# Patient Record
Sex: Female | Born: 2000
Health system: Southern US, Community
[De-identification: ages and names within clinical notes are randomized; demographics above are authoritative.]

## PROBLEM LIST (undated history)

## (undated) DIAGNOSIS — N63 Unspecified lump in unspecified breast: Secondary | ICD-10-CM

## (undated) DIAGNOSIS — R109 Unspecified abdominal pain: Secondary | ICD-10-CM

## (undated) DIAGNOSIS — F32A Depression, unspecified: Secondary | ICD-10-CM

## (undated) DIAGNOSIS — F329 Major depressive disorder, single episode, unspecified: Secondary | ICD-10-CM

## (undated) DIAGNOSIS — F419 Anxiety disorder, unspecified: Secondary | ICD-10-CM

## (undated) DIAGNOSIS — Z8489 Family history of other specified conditions: Secondary | ICD-10-CM

## (undated) DIAGNOSIS — K589 Irritable bowel syndrome without diarrhea: Secondary | ICD-10-CM

## (undated) DIAGNOSIS — A749 Chlamydial infection, unspecified: Secondary | ICD-10-CM

## (undated) HISTORY — PX: NO PAST SURGERIES: SHX2092

## (undated) HISTORY — DX: Anxiety disorder, unspecified: F41.9

## (undated) HISTORY — DX: Major depressive disorder, single episode, unspecified: F32.9

## (undated) HISTORY — DX: Depression, unspecified: F32.A

## (undated) HISTORY — DX: Unspecified abdominal pain: R10.9

## (undated) HISTORY — DX: Chlamydial infection, unspecified: A74.9

---
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
## 2019-01-22 ENCOUNTER — Ambulatory Visit (INDEPENDENT_AMBULATORY_CARE_PROVIDER_SITE_OTHER): Payer: BLUE CROSS/BLUE SHIELD | Admitting: Obstetrics and Gynecology

## 2019-01-22 ENCOUNTER — Other Ambulatory Visit (HOSPITAL_COMMUNITY)
Admission: RE | Admit: 2019-01-22 | Discharge: 2019-01-22 | Disposition: A | Discharge status: Home / Self Care | Payer: BLUE CROSS/BLUE SHIELD | Source: Ambulatory Visit | Attending: Obstetrics and Gynecology | Admitting: Obstetrics and Gynecology

## 2019-01-22 ENCOUNTER — Encounter: Payer: Self-pay | Admitting: Obstetrics and Gynecology

## 2019-01-22 VITALS — BP 92/70 | Ht 59.0 in | Wt 111.0 lb

## 2019-01-22 DIAGNOSIS — A749 Chlamydial infection, unspecified: Secondary | ICD-10-CM | POA: Diagnosis not present

## 2019-01-22 DIAGNOSIS — R350 Frequency of micturition: Secondary | ICD-10-CM | POA: Diagnosis not present

## 2019-01-22 DIAGNOSIS — Z113 Encounter for screening for infections with a predominantly sexual mode of transmission: Secondary | ICD-10-CM

## 2019-01-22 LAB — POCT URINALYSIS DIPSTICK
Bilirubin, UA: NEGATIVE
GLUCOSE UA: NEGATIVE
KETONES UA: NEGATIVE
Leukocytes, UA: NEGATIVE
SPEC GRAV UA: 1.025 (ref 1.010–1.025)
pH, UA: 6 (ref 5.0–8.0)

## 2019-01-22 NOTE — Progress Notes (Signed)
Chief Complaint  Patient presents with  . Follow-up    TOC  . Urinary Tract Infection    burning sensation at the end of urinating, frequency, believes saw blood in urine, odor occassionally x 3 days     HPI:      Ms. Kimberly Ware is a 18 y.o. G0P0000 who LMP was No LMP recorded. (Menstrual status: IUD)., presents today for STD test of cure. She was diagnosed with chlamydia December 25, 2018. She was treated with azithro 1 g without problems. Her partner has been treated but she is no longer with him. She denies any further symptoms.  She has had urinary frequency/urgency and dysuria at end of flow for about 4 days. Had 1 day hematuria that resolved. No LBP, fevers. Taking AZO with some improvement. Never had UTI before. Doesn't drink caffeine but eats a lot of chocolate.   Past Medical History:  Diagnosis Date  . Abdominal pain, recurrent   . Anxiety   . Breast mass 09/2017   resolved 12/18; neg breast u/s  . Chlamydia 10/2017  . Depression   . Family history of adverse reaction to anesthesia    Mother - PONV  . Spastic colon     Social History   Socioeconomic History  . Marital status: Single    Spouse name: Not on file  . Number of children: Not on file  . Years of education: Not on file  . Highest education level: Not on file  Occupational History  . Not on file  Social Needs  . Financial resource strain: Not on file  . Food insecurity:    Worry: Not on file    Inability: Not on file  . Transportation needs:    Medical: Not on file    Non-medical: Not on file  Tobacco Use  . Smoking status: Never Smoker  . Smokeless tobacco: Former Engineer, water and Sexual Activity  . Alcohol use: No  . Drug use: No  . Sexual activity: Yes    Birth control/protection: I.U.D.    Comment: Kyleena  Lifestyle  . Physical activity:    Days per week: 7 days    Minutes per session: 120 min  . Stress: Not at all  Relationships  . Social connections:    Talks on phone:  More than three times a week    Gets together: More than three times a week    Attends religious service: 1 to 4 times per year    Active member of club or organization: Yes    Attends meetings of clubs or organizations: More than 4 times per year    Relationship status: Never married  . Intimate partner violence:    Fear of current or ex partner: No    Emotionally abused: No    Physically abused: No    Forced sexual activity: No  Other Topics Concern  . Not on file  Social History Narrative   Starting 6th grade.    Current Outpatient Medications on File Prior to Visit  Medication Sig Dispense Refill  . sertraline (ZOLOFT) 50 MG tablet Take by mouth.    . Levonorgestrel (KYLEENA) 19.5 MG IUD 19.5 mg by Intrauterine route once for 1 dose. 1 Intra Uterine Device 0   No current facility-administered medications on file prior to visit.       ROS:  Review of Systems  Constitutional: Negative for fever.  Gastrointestinal: Negative for blood in stool, constipation, diarrhea, nausea and vomiting.  Genitourinary: Positive for dysuria, frequency, urgency and vaginal bleeding. Negative for dyspareunia, flank pain, hematuria, vaginal discharge and vaginal pain.  Musculoskeletal: Negative for back pain.  Skin: Negative for rash.     Objective: BP 92/70   Ht 4\' 11"  (1.499 m)   Wt 111 lb (50.3 kg)   BMI 22.42 kg/m    Physical Exam Constitutional:      General: She is not in acute distress. Genitourinary:     Vulva, cervix, uterus, right adnexa and left adnexa normal.     No vulval lesion, tenderness or ulcerations noted.     Vaginal bleeding present.     No vaginal discharge, erythema or tenderness.     IUD strings visualized.     Uterus is not enlarged or tender.     No right or left adnexal mass present.     Right adnexa not tender.     Left adnexa not tender.  Pulmonary:     Effort: Pulmonary effort is normal.  Musculoskeletal: Normal range of motion.  Neurological:       General: No focal deficit present.     Mental Status: She is alert.     Cranial Nerves: No cranial nerve deficit.  Skin:    General: Skin is warm and dry.  Psychiatric:        Mood and Affect: Mood normal.        Behavior: Behavior normal.        Thought Content: Thought content normal.        Judgment: Judgment normal.  Vitals signs and nursing note reviewed.    RESULTS: Results for orders placed or performed in visit on 01/22/19 (from the past 24 hour(s))  POCT Urinalysis Dipstick     Status: Normal   Collection Time: 01/22/19 10:28 AM  Result Value Ref Range   Color, UA orange    Clarity, UA clear    Glucose, UA Negative Negative   Bilirubin, UA neg    Ketones, UA neg    Spec Grav, UA 1.025 1.010 - 1.025   Blood, UA trace    pH, UA 6.0 5.0 - 8.0   Protein, UA     Urobilinogen, UA     Nitrite, UA     Leukocytes, UA Negative Negative   Appearance     Odor    VAGINAL SPOTTING ON EXAM  Assessment/Plan: Chlamydia - TOC today. Will call with results.  - Plan: Cervicovaginal ancillary only  Screening for STD (sexually transmitted disease) - Plan: Cervicovaginal ancillary only  Urinary frequency - Neg UA. Check C&S. Will f/u with results. F/u sooner if sx worsen. Question caffeine related--d/c to see if sx improve.  - Plan: Urine Culture, POCT Urinalysis Dipstick   F/U  Return if symptoms worsen or fail to improve.  Asiya Cutbirth B. Nelva Hauk, PA-C 01/22/2019 10:29 AM

## 2019-01-22 NOTE — Patient Instructions (Signed)
I value your feedback and entrusting us with your care. If you get a [facility] patient survey, I would appreciate you taking the time to let us know about your experience today. Thank you! 

## 2019-01-23 LAB — CERVICOVAGINAL ANCILLARY ONLY
Chlamydia: NEGATIVE
Neisseria Gonorrhea: NEGATIVE
TRICH (WINDOWPATH): NEGATIVE

## 2019-01-23 NOTE — Progress Notes (Signed)
Pt aware. Mentioned she had already seen it through mychart.

## 2019-01-23 NOTE — Progress Notes (Signed)
Pls let pt know STD testing neg. Thx.

## 2019-01-24 ENCOUNTER — Other Ambulatory Visit: Payer: Self-pay | Admitting: Obstetrics and Gynecology

## 2019-01-24 LAB — URINE CULTURE

## 2019-01-24 MED ORDER — NITROFURANTOIN MONOHYD MACRO 100 MG PO CAPS: 100.0000 mg | ORAL_CAPSULE | Freq: Two times a day (BID) | ORAL | 0 refills | Status: AC

## 2019-01-24 NOTE — Progress Notes (Signed)
Pt aware.

## 2019-01-24 NOTE — Progress Notes (Signed)
Rx macrobid for UTI on C&S  

## 2019-01-24 NOTE — Progress Notes (Signed)
Pls let pt know C&S showed UTI. Rx macrobid eRxd. F/u prn.

## 2019-07-10 ENCOUNTER — Telehealth: Payer: Self-pay

## 2019-07-10 NOTE — Telephone Encounter (Signed)
Patient would like to speak to ABC. She has unspecified questions. Cb#[phone]

## 2019-07-10 NOTE — Telephone Encounter (Signed)
Attempted to reach patient.  Mailbox full; unable to leave message.

## 2019-07-10 NOTE — Telephone Encounter (Signed)
Attempted to reach patient. Mail box full/unable to leave message. Message closed d/t unable to reach patient.

## 2019-07-31 DIAGNOSIS — M79632 Pain in left forearm: Secondary | ICD-10-CM | POA: Diagnosis not present

## 2019-07-31 DIAGNOSIS — M79602 Pain in left arm: Secondary | ICD-10-CM | POA: Diagnosis not present

## 2019-11-01 DIAGNOSIS — Z20828 Contact with and (suspected) exposure to other viral communicable diseases: Secondary | ICD-10-CM | POA: Diagnosis not present

## 2019-11-01 DIAGNOSIS — Z1159 Encounter for screening for other viral diseases: Secondary | ICD-10-CM | POA: Diagnosis not present

## 2019-11-01 DIAGNOSIS — Z6822 Body mass index (BMI) 22.0-22.9, adult: Secondary | ICD-10-CM | POA: Diagnosis not present

## 2019-12-31 DIAGNOSIS — Z03818 Encounter for observation for suspected exposure to other biological agents ruled out: Secondary | ICD-10-CM | POA: Diagnosis not present

## 2020-01-08 DIAGNOSIS — Z20822 Contact with and (suspected) exposure to covid-19: Secondary | ICD-10-CM | POA: Diagnosis not present

## 2020-02-03 DIAGNOSIS — Z1159 Encounter for screening for other viral diseases: Secondary | ICD-10-CM | POA: Diagnosis not present

## 2020-04-21 DIAGNOSIS — Z881 Allergy status to other antibiotic agents status: Secondary | ICD-10-CM | POA: Diagnosis not present

## 2020-04-21 DIAGNOSIS — J029 Acute pharyngitis, unspecified: Secondary | ICD-10-CM | POA: Diagnosis not present

## 2020-04-21 DIAGNOSIS — R05 Cough: Secondary | ICD-10-CM | POA: Diagnosis not present

## 2020-04-21 DIAGNOSIS — Z20822 Contact with and (suspected) exposure to covid-19: Secondary | ICD-10-CM | POA: Diagnosis not present

## 2020-04-21 DIAGNOSIS — J069 Acute upper respiratory infection, unspecified: Secondary | ICD-10-CM | POA: Diagnosis not present

## 2020-04-22 DIAGNOSIS — J40 Bronchitis, not specified as acute or chronic: Secondary | ICD-10-CM | POA: Diagnosis not present

## 2020-07-28 DIAGNOSIS — F419 Anxiety disorder, unspecified: Secondary | ICD-10-CM | POA: Diagnosis not present

## 2021-08-24 ENCOUNTER — Ambulatory Visit: Payer: BLUE CROSS/BLUE SHIELD | Admitting: Dermatology

## 2021-08-24 ENCOUNTER — Other Ambulatory Visit: Payer: Self-pay

## 2021-09-02 ENCOUNTER — Ambulatory Visit: Payer: BLUE CROSS/BLUE SHIELD | Admitting: Dermatology

## 2021-09-16 ENCOUNTER — Ambulatory Visit (INDEPENDENT_AMBULATORY_CARE_PROVIDER_SITE_OTHER): Payer: 59 | Admitting: Dermatology

## 2021-09-16 ENCOUNTER — Other Ambulatory Visit: Payer: Self-pay

## 2021-09-16 DIAGNOSIS — B354 Tinea corporis: Secondary | ICD-10-CM | POA: Diagnosis not present

## 2021-09-16 DIAGNOSIS — L700 Acne vulgaris: Secondary | ICD-10-CM

## 2021-09-16 DIAGNOSIS — B360 Pityriasis versicolor: Secondary | ICD-10-CM

## 2021-09-16 DIAGNOSIS — L0100 Impetigo, unspecified: Secondary | ICD-10-CM | POA: Diagnosis not present

## 2021-09-16 MED ORDER — MUPIROCIN 2 % EX OINT: 1.0000 "application " | TOPICAL_OINTMENT | Freq: Two times a day (BID) | CUTANEOUS | 0 refills | Status: DC

## 2021-09-16 MED ORDER — TRETINOIN 0.025 % EX CREA: TOPICAL_CREAM | Freq: Every day | CUTANEOUS | 2 refills | Status: DC

## 2021-09-16 MED ORDER — DOXYCYCLINE HYCLATE 20 MG PO TABS: 20.0000 mg | ORAL_TABLET | Freq: Two times a day (BID) | ORAL | 1 refills | Status: AC

## 2021-09-16 MED ORDER — CLINDAMYCIN PHOS-BENZOYL PEROX 1-5 % EX GEL: CUTANEOUS | 2 refills | Status: DC

## 2021-09-16 MED ORDER — TERBINAFINE HCL 1 % EX CREA: 1.0000 "application " | TOPICAL_CREAM | Freq: Two times a day (BID) | CUTANEOUS | 1 refills | Status: DC

## 2021-09-16 NOTE — Progress Notes (Addendum)
New Patient Visit  Subjective  Kimberly Ware is a 20 y.o. female who presents for the following: New Patient (Initial Visit) (Patient here today for acne and to have some moles checked. Patient reports she has tried spironolactone in past to treat pocs. Patient has been using some products aestician has prescribed. Patient gets break outs at face, chest, back.  She reports she is no longer taking. Patient reports a history of tanning. Patient reports her maternal grandfather and paternal cousin. ).   The following portions of the chart were reviewed this encounter and updated as appropriate:   Tobacco  Allergies  Meds  Problems  Med Hx  Surg Hx  Fam Hx      Review of Systems:  No other skin or systemic complaints except as noted in HPI or Assessment and Plan.  Objective  Well appearing patient in no apparent distress; mood and affect are within normal limits.  A focused examination was performed including face, chest, back, shoulders left thigh, . Relevant physical exam findings are noted in the Assessment and Plan.  face, chest, back Single scar right cheek, 1 plus open and closed comedones scattered inflammatory papules, back with few scars, scattered inflammatory papules, arm with inflammatory papules,   Left Chin Crusted erosion  Left Thigh - Anterior Annular scaly pink plaque   Assessment & Plan  Acne vulgaris face, chest, back  Chronic condition with duration or expected duration over one year. Condition is bothersome to patient. Currently flared.  Patient reports she has iud Has history of orthostatic hypotension and was unable to continue spironolactone  Start Doxycycline 20 mg take 1 tab by mouth twice daily with food  Doxycycline should be taken with food to prevent nausea. Do not lay down for 30 minutes after taking. Be cautious with sun exposure and use good sun protection while on this medication. Pregnant women should not take this medication.   Night  time cream -  tretinion 0.025 % cream apply to face nightly   Topical retinoid medications like tretinoin/Retin-A, adapalene/Differin, tazarotene/Fabior, and Epiduo/Epiduo Forte can cause dryness and irritation when first started. Only apply a pea-sized amount to the entire affected area. Avoid applying it around the eyes, edges of mouth and creases at the nose. If you experience irritation, use a good moisturizer first and/or apply the medicine less often. If you are doing well with the medicine, you can increase how often you use it until you are applying every night. Be careful with sun protection while using this medication as it can make you sensitive to the sun. This medicine should not be used by pregnant women.   Start Clindamycin - benzoyl peroxide  gel apply to face in am   Benzoyl peroxide can cause dryness and irritation of the skin. It can also bleach fabric. When used together with Aczone (dapsone) cream, it can stain the skin orange.   clindamycin-benzoyl peroxide (BENZACLIN) gel - face, chest, back Apply topically every morning. Apply to face  tretinoin (RETIN-A) 0.025 % cream - face, chest, back Apply topically at bedtime. Apply to face  doxycycline (PERIOSTAT) 20 MG tablet - face, chest, back Take 1 tablet (20 mg total) by mouth 2 (two) times daily. Take with food  Impetigo Left Chin  Apply mupirocin three times daily to affected area of left chin until healed.   mupirocin ointment (BACTROBAN) 2 % - Left Chin Apply 1 application topically 2 (two) times daily. Apply to chin.  Until healed.  Tinea  Corporis Left Thigh - Anterior  Start Terbinafine 1 % cream apply 1 application twice daily to affected area of left thigh until clear.    terbinafine (LAMISIL) 1 % cream - Left Thigh - Anterior Apply 1 application topically 2 (two) times daily. To affected area at left thigh until clear  Return for 2 month acne follow up. I, Asher Muir, CMA, am acting as scribe for  Darden Dates, MD.  Documentation: I have reviewed the above documentation for accuracy and completeness, and I agree with the above.  Darden Dates, MD

## 2021-09-16 NOTE — Patient Instructions (Addendum)
Doxycycline should be taken with food to prevent nausea. Do not lay down for 30 minutes after taking. Be cautious with sun exposure and use good sun protection while on this medication. Pregnant women should not take this medication.   Topical retinoid medications like tretinoin/Retin-A, adapalene/Differin, tazarotene/Fabior, and Epiduo/Epiduo Forte can cause dryness and irritation when first started. Only apply a pea-sized amount to the entire affected area. Avoid applying it around the eyes, edges of mouth and creases at the nose. If you experience irritation, use a good moisturizer first and/or apply the medicine less often. If you are doing well with the medicine, you can increase how often you use it until you are applying every night. Be careful with sun protection while using this medication as it can make you sensitive to the sun. This medicine should not be used by pregnant women.   Terbinafine Counseling  Terbinafine is an anti-fungal medicine that can be applied to the skin (over the counter) or taken by mouth (prescription) to treat fungal infections. The pill version is often used to treat fungal infections of the nails or scalp. While most people do not have any side effects from taking terbinafine pills, some possible side effects of the medicine can include taste changes, headache, loss of smell, vision changes, nausea, vomiting, or diarrhea.   Rare side effects can include irritation of the liver, allergic reaction, or decrease in blood counts (which may show up as not feeling well or developing an infection). If you are concerned about any of these side effects, please stop the medicine and call your doctor, or in the case of an emergency such as feeling very unwell, seek immediate medical care.    If you have any questions or concerns for your doctor, please call our main line at [phone] and press option 4 to reach your doctor's medical assistant. If no one answers, please leave a  voicemail as directed and we will return your call as soon as possible. Messages left after 4 pm will be answered the following business day.   You may also send [place] a message via MyChart. We typically respond to MyChart messages within 1-2 business days.  For prescription refills, please ask your pharmacy to contact our office. Our fax number is [phone].  If you have an urgent issue when the clinic is closed that cannot wait until the next business day, you can page your doctor at the number below.    Please note that while we do our best to be available for urgent issues outside of office hours, we are not available 24/7.   If you have an urgent issue and are unable to reach [place], you may choose to seek medical care at your doctor's office, retail clinic, urgent care center, or emergency room.  If you have a medical emergency, please immediately call 911 or go to the emergency department.  Pager Numbers  - Dr. Gwen Pounds: [phone]  - Dr. Neale Burly: [phone]  - Dr. Roseanne Reno: [phone]  In the event of inclement weather, please call our main line at [phone] for an update on the status of any delays or closures.  Dermatology Medication Tips: Please keep the boxes that topical medications come in in order to help keep track of the instructions about where and how to use these. Pharmacies typically print the medication instructions only on the boxes and not directly on the medication tubes.   If your medication is too expensive, please contact our office at [phone] option 4  or send [place] a message through MyChart.   We are unable to tell what your co-pay for medications will be in advance as this is different depending on your insurance coverage. However, we may be able to find a substitute medication at lower cost or fill out paperwork to get insurance to cover a needed medication.   If a prior authorization is required to get your medication covered by your insurance  company, please allow [place] 1-2 business days to complete this process.  Drug prices often vary depending on where the prescription is filled and some pharmacies may offer cheaper prices.  The website www.goodrx.com contains coupons for medications through different pharmacies. The prices here do not account for what the cost may be with help from insurance (it may be cheaper with your insurance), but the website can give you the price if you did not use any insurance.  - You can print the associated coupon and take it with your prescription to the pharmacy.  - You may also stop by our office during regular business hours and pick up a GoodRx coupon card.  - If you need your prescription sent electronically to a different pharmacy, notify our office through [facility] or by phone at [phone] option 4.

## 2021-09-20 ENCOUNTER — Telehealth: Payer: Self-pay

## 2021-09-20 NOTE — Telephone Encounter (Signed)
Patient was seen 09/16/21 and was prescribed clindamycin-BP to use in the mornings to entire face, tretinoin 0.025% cream to use at bedtime and doxycycline 20mg  to take twice daily with food. Patient called to advise yesterday morning she applied the clindamycin-BP and a few hours later she had tingling and burning at her face. She washed her face and applied aloe. I advised patient to discontinue clindamycin-BP and tretinoin cream and to only use gentle cleanser and moisturizer as needed and that she should continue the mupirocin to chin. Patient advised that Dr. is out of the office on Mondays and that this message will be sent to Dr. 1975 and that we will respond once reviewed.

## 2021-09-21 ENCOUNTER — Telehealth: Payer: Self-pay

## 2021-09-21 ENCOUNTER — Other Ambulatory Visit: Payer: Self-pay

## 2021-09-21 DIAGNOSIS — L700 Acne vulgaris: Secondary | ICD-10-CM

## 2021-09-21 MED ORDER — HYDROCORTISONE 2.5 % EX CREA: TOPICAL_CREAM | Freq: Two times a day (BID) | CUTANEOUS | 1 refills | Status: DC | PRN

## 2021-09-21 MED ORDER — AMZEEQ 4 % EX FOAM: 1.0000 "application " | Freq: Every day | CUTANEOUS | 1 refills | Status: AC

## 2021-09-21 MED ORDER — TRETINOIN 0.025 % EX GEL: Freq: Every day | CUTANEOUS | 2 refills | Status: AC

## 2021-09-21 NOTE — Telephone Encounter (Signed)
Spoke with patient and she said the irritation at her face is resolved. I told her to d/c clindamycin-BP and that I would send in [facility] 2.5% cream for her to use twice daily for up to 2 weeks if needed for rash, sent to [facility] per patient's request. I also advised patient once face is no longer burning or irritated to restart tretinoin 0.025% cream pea sized amount two evenings per week. If tolerating well after 1 well, increase to every other night. If tolerating well after another week, increased to every night. Patient advised to start Amzeeq with tretinoin as directed, sent to [facility].

## 2021-09-21 NOTE — Telephone Encounter (Signed)
Yes, thank you.

## 2021-09-21 NOTE — Telephone Encounter (Signed)
Tretinoin cream not covered by pt insurance. OK to switch to tretinoin 0.025% gel?

## 2021-09-21 NOTE — Telephone Encounter (Signed)
Patient probably got too irritated with combination of tretinoin and benzoyl peroxide. Recommend starting hydrocortisone 2.5% cream twice a day for up to 2 weeks until skin no longer burning or irritated. Then restart tretinoin 0.025% cream pea sized amount two evenings per week. If tolerating well after 1 well, increase to every other night. If tolerating well after another week, increased to every night.   Will discontinue clinda-BP and submit for Amzeeq, pea sized amount to entire face at night (after tretinoin on the nights she is using tretinoin). This will help calm down acne and also be more soothing/hydrating for the skin. Thank you!

## 2021-09-21 NOTE — Addendum Note (Signed)
Addended by: Epifania Gore on: 09/21/2021 04:13 PM   Modules accepted: Orders

## 2021-09-29 ENCOUNTER — Encounter: Payer: Self-pay | Admitting: Dermatology

## 2021-09-29 ENCOUNTER — Ambulatory Visit (INDEPENDENT_AMBULATORY_CARE_PROVIDER_SITE_OTHER): Payer: 59 | Admitting: Dermatology

## 2021-09-29 ENCOUNTER — Other Ambulatory Visit: Payer: Self-pay

## 2021-09-29 DIAGNOSIS — B009 Herpesviral infection, unspecified: Secondary | ICD-10-CM | POA: Diagnosis not present

## 2021-09-29 DIAGNOSIS — L700 Acne vulgaris: Secondary | ICD-10-CM

## 2021-09-29 MED ORDER — VALACYCLOVIR HCL 1 G PO TABS: ORAL_TABLET | ORAL | 1 refills | Status: DC

## 2021-09-29 NOTE — Patient Instructions (Addendum)
Continue Doxycycline 20mg  twice daily.   Continue Amzeeq at bedtime, over tretinoin on nights using tretinoin.   Continue Tretinoin at bedtime. Avoid left side of chin and jaw until resolved.  Valacyclovir 1 gm take 2 tonight, repeat in the morning.   Okay to start Hydrocortisone after dose of Valacyclovir tomorrow morning.    Recommend using Cln Acne Wash daily, leave on for 1-2 minutes before rinsing off. This can be purchased at or online.  Or recommend using [employer] daily, leave on for 1-2 minutes before rinsing off. This can be purchased at [employer] or online.    If you have any questions or concerns for your doctor, please call our main line at [phone] and press option 4 to reach your doctor's medical assistant. If no one answers, please leave a voicemail as directed and we will return your call as soon as possible. Messages left after 4 pm will be answered the following business day.   You may also send [phone] a message via MyChart. We typically respond to MyChart messages within 1-2 business days.  For prescription refills, please ask your pharmacy to contact our office. Our fax number is [phone].  If you have an urgent issue when the clinic is closed that cannot wait until the next business day, you can page your doctor at the number below.    Please note that while we do our best to be available for urgent issues outside of office hours, we are not available 24/7.   If you have an urgent issue and are unable to reach [phone], you may choose to seek medical care at your doctor's office, retail clinic, urgent care center, or emergency room.  If you have a medical emergency, please immediately call 911 or go to the emergency department.  Pager Numbers  - Dr. [place]: [phone]  - Dr. [phone]: [phone]  - Dr. [phone]: [phone]  In the event of inclement weather, please call our main line at [phone] for an update on the  status of any delays or closures.  Dermatology Medication Tips: Please keep the boxes that topical medications come in in order to help keep track of the instructions about where and how to use these. Pharmacies typically print the medication instructions only on the boxes and not directly on the medication tubes.   If your medication is too expensive, please contact our office at [phone] option 4 or send [phone] a message through MyChart.   We are unable to tell what your co-pay for medications will be in advance as this is different depending on your insurance coverage. However, we may be able to find a substitute medication at lower cost or fill out paperwork to get insurance to cover a needed medication.   If a prior authorization is required to get your medication covered by your insurance company, please allow [place] 1-2 business days to complete this process.  Drug prices often vary depending on where the prescription is filled and some pharmacies may offer cheaper prices.  The website www.goodrx.com contains coupons for medications through different pharmacies. The prices here do not account for what the cost may be with help from insurance (it may be cheaper with your insurance), but the website can give you the price if you did not use any insurance.  - You can print the associated coupon and take it with your prescription to the pharmacy.  - You may also stop by our office during regular business  hours and pick up a GoodRx coupon card.  - If you need your prescription sent electronically to a different pharmacy, notify our office through [facility] or by phone at [phone] option 4.

## 2021-09-29 NOTE — Progress Notes (Signed)
   Follow-Up Visit   Subjective  Kimberly Ware is a 20 y.o. female who presents for the following: Rash (Woke up with burning sensation on left chin/jaw this morning. Has been using medications for acne as prescribed. Has been avoiding using tretinoin to this area. ).  The following portions of the chart were reviewed this encounter and updated as appropriate:  Tobacco  Allergies  Meds  Problems  Med Hx  Surg Hx  Fam Hx      Review of Systems: No other skin or systemic complaints except as noted in HPI or Assessment and Plan.   Objective  Well appearing patient in no apparent distress; mood and affect are within normal limits.  A focused examination was performed including face, chest, back. Relevant physical exam findings are noted in the Assessment and Plan.  Left Chin, left jaw Erythematous patch  face, back Erythematous papules and comedones   Assessment & Plan  Herpes simplex Left Chin, left jaw  Possible HSV vs irritant dermatitis. Pt reports burning sensation with onset this episode and previously.  Start Valacyclovir 1 gm. Take 2 pills at first sign then 2 pills 12 hours later.  HSV PCR performed today  Continue mupirocin TID as well  Reviewed contagious nature of HSV, wash hands well after touching   Avoid irritating creams such as tretinoin. Wash with gentle cleanser  valACYclovir (VALTREX) 1000 MG tablet - Left Chin, left jaw Take 2 tablets tonight, repeat tomorrow morning.  Related Procedures HSV and VZV PCR Panel  Acne vulgaris face, back  Chronic condition with duration over one year. Condition is bothersome to patient. Currently flared. Patient notes slight improvement since last visit.  Continue Doxycycline 20mg  bid, tretinoin qhs as tolerated and then Amzeeq qhs. Reassured that it can take time for medications to work and to continue treatment. Reviewed correct use.   Topical retinoid medications like tretinoin/Retin-A, adapalene/Differin,  tazarotene/Fabior, and Epiduo/Epiduo Forte can cause dryness and irritation when first started. Only apply a pea-sized amount to the entire affected area. Avoid applying it around the eyes, edges of mouth and creases at the nose. If you experience irritation, use a good moisturizer first and/or apply the medicine less often. If you are doing well with the medicine, you can increase how often you use it until you are applying every night. Be careful with sun protection while using this medication as it can make you sensitive to the sun. This medicine should not be used by pregnant women.    Related Medications clindamycin-benzoyl peroxide (BENZACLIN) gel Apply topically every morning. Apply to face  doxycycline (PERIOSTAT) 20 MG tablet Take 1 tablet (20 mg total) by mouth 2 (two) times daily. Take with food  tretinoin (RETIN-A) 0.025 % gel Apply topically at bedtime.  Return for acne recheck as scheduled.  I, , CMA, am acting as scribe for Lawson Radar, MD.  Documentation: I have reviewed the above documentation for accuracy and completeness, and I agree with the above.  Darden Dates, MD

## 2021-10-04 ENCOUNTER — Telehealth: Payer: Self-pay

## 2021-10-04 LAB — HSV AND VZV PCR PANEL
HSV 2 DNA: NEGATIVE
HSV-1 DNA: NEGATIVE
Varicella-Zoster, PCR: NEGATIVE

## 2021-10-04 NOTE — Telephone Encounter (Signed)
Pt came into office today to request refill of valtrex. She states that she took the initial dose and already refilled it and was taking one a day over the weekend to stretch it out. Pt states that it did help the burning. She was also asking about her culture results. Advised pt that we would call with results as soon as they were signed off.

## 2021-10-05 ENCOUNTER — Telehealth: Payer: Self-pay | Admitting: Obstetrics and Gynecology

## 2021-10-05 NOTE — Telephone Encounter (Signed)
Called and left vm for pt to call back for results.

## 2021-10-05 NOTE — Telephone Encounter (Signed)
Patient advised of information per Dr. Neale Burly. Patient wants to be seen again. Advised patient she has appointment with Dr. Neale Burly this Thursday.

## 2021-10-05 NOTE — Telephone Encounter (Signed)
Please advise patient that results have come back and did not show cold sore virus. Given this, I believe she had significant irritation in the area with the bacterial infection rather than a viral infection. Also please advise her that taking two tablets once and repeating in 12 hours was a full treatment for the cold sore virus and the medication should not be continued at that dose long-term.    Please recommend she use hydrocortisone 2.5% cream to any areas that are still bothersome. Thank you!

## 2021-10-05 NOTE — Telephone Encounter (Signed)
Contacted patient per her mother's request to discuss concerns about recent treatment for a skin disorder.  Patient unavailable, left voicemail for her return phone call to the office.    Contacted patient's mother, notes that patient has some issues (h/o PCOS, recently diagnosed with a HSV infection by Dermatology, on her face, but cultures negative). Notes that she needs to get established as she has relocated from [place]. Has been told that next available appointment is February. I informed that she can be placed on wait list, and that I would try to help expedite her appointment if possible.    Hildred Laser, MD [facility]

## 2021-10-07 ENCOUNTER — Other Ambulatory Visit: Payer: Self-pay

## 2021-10-07 ENCOUNTER — Ambulatory Visit (INDEPENDENT_AMBULATORY_CARE_PROVIDER_SITE_OTHER): Payer: 59 | Admitting: Dermatology

## 2021-10-07 DIAGNOSIS — L700 Acne vulgaris: Secondary | ICD-10-CM | POA: Diagnosis not present

## 2021-10-07 DIAGNOSIS — B354 Tinea corporis: Secondary | ICD-10-CM

## 2021-10-07 DIAGNOSIS — R21 Rash and other nonspecific skin eruption: Secondary | ICD-10-CM | POA: Diagnosis not present

## 2021-10-07 NOTE — Addendum Note (Signed)
Addended by: Sandi Mealy on: 10/07/2021 04:16 PM   Modules accepted: Level of Service

## 2021-10-07 NOTE — Patient Instructions (Signed)

## 2021-10-07 NOTE — Progress Notes (Signed)
Follow-Up Visit   Subjective  Kimberly Ware is a 20 y.o. female who presents for the following: Acne (Patient was unable to tolerate the Clindamycin/BP gel QAM. She is using Amzeeq foam, Tretinoin 0.025% gel QHS, and Doxycycline 20mg  po BID. She is currently flaring on her L cheek which is very painful, and would like to know when it is safe to use the Tretinoin on her chin. ), tinea corporus (Of the L thigh - patient currently using Lamisil BID and covering with bandage, but rash hasn't resolved. She would like it checked today. ), and hx skin infection  (On the chin - currently using Mupirocin 2% ointment on aa BID. Patient thinks area has healed and would like to discuss what the possible causes were, if it will recur, and what she should do if it does recur. Patient states that area healed after using the Valtrex taking 2 tabs po at onset of outbreak. Then 2 more tabs 12h later. She then refilled the prescription and stretched it out over the weekend. ).  The following portions of the chart were reviewed this encounter and updated as appropriate:   Tobacco  Allergies  Meds  Problems  Med Hx  Surg Hx  Fam Hx      Review of Systems:  No other skin or systemic complaints except as noted in HPI or Assessment and Plan.  Objective  Well appearing patient in no apparent distress; mood and affect are within normal limits.  A focused examination was performed including the face and L leg. Relevant physical exam findings are noted in the Assessment and Plan.  L ant thigh Thin faintly scaly erythematous patches  Face Trace to 1+ open comedones with scattered small inflammatory papules and single cyst left cheek.   Chin Clear today.   Assessment & Plan  Tinea corporis L ant thigh  Significantly improved.   With possible irritant dermatitis from Lamisil -   Continue Lamisil cream BID and start HC 2.5% cream to aa BID PRN.  Call if not resolved in another couple of  weeks.  Related Medications terbinafine (LAMISIL) 1 % cream Apply 1 application topically 2 (two) times daily. To affected area at left thigh until clear  Acne vulgaris Face  Chronic condition with duration over one year. Condition is bothersome to patient. Currently flared.  Reviewed again with patient that it can take 2-3 months for medications to work and does require some patience. She further was unable to use her topical medications to all areas of the face because of irritation/infection at the left side of the face. She previously had some lightheadedness with spironolactone so we deferred that at this time, but may consider trying a lower dose for her at follow-up if she is not getting clear with current treatments.   Patient with hx of PCOS - she plans to speak with GYN about treatment options and discuss spironolactone with them at that time.  Continue Tretinoin 0.025% gel QHS. Topical retinoid medications like tretinoin/Retin-A, adapalene/Differin, tazarotene/Fabior, and Epiduo/Epiduo Forte can cause dryness and irritation when first started. Only apply a pea-sized amount to the entire affected area. Avoid applying it around the eyes, edges of mouth and creases at the nose. If you experience irritation, use a good moisturizer first and/or apply the medicine less often. If you are doing well with the medicine, you can increase how often you use it until you are applying every night. Be careful with sun protection while using this medication as  it can make you sensitive to the sun. This medicine should not be used by pregnant women.   Continue Amzeeq QHS.   Continue Doxycycline 20mg  po BID. Doxycycline should be taken with food to prevent nausea. Do not lay down for 30 minutes after taking. Be cautious with sun exposure and use good sun protection while on this medication. Pregnant women should not take this medication.   OK to use Benzaclin to spot treat acne papules.  If current  flare still bothersome next week, patient to call and we can switch to doxycycline 100 mg twice a day for 2 weeks to help calm things faster before dropping back to doxycycline 20 mg twice a day   Related Medications clindamycin-benzoyl peroxide (BENZACLIN) gel Apply topically every morning. Apply to face  doxycycline (PERIOSTAT) 20 MG tablet Take 1 tablet (20 mg total) by mouth 2 (two) times daily. Take with food  tretinoin (RETIN-A) 0.025 % gel Apply topically at bedtime.  Rash and other nonspecific skin eruption Chin  HSV vs bacterial infection at left lower face - HSV and VZV PCR negative  Discussed with patient that even though HSV and VZV PCR panel were negative it is still possible for her to have the HSV virus. Herpes Simplex Virus = Cold Sores = Fever Blisters is a chronic recurring blistering; scabbing sore-producing viral infection that is recurrent usually in the same area triggered by stress, sun/UV exposure and trauma.  It is infectious and can be spread from person to person by direct contact.  It is not curable, but is treatable with topical and oral medication.  If rash recurs, start Valtrex 1 gram 2 tab po at onset of outbreak and 2 more tabs po 12h later. Advised she should then stop taking the medication and the infection has been treated. Can call for repeat evaluation to help determine if HSV at that time.   Return for appointment as scheduled.  , CMA, am acting as scribe for Maylene Roes, MD .  Documentation: I have reviewed the above documentation for accuracy and completeness, and I agree with the above.  Darden Dates, MD

## 2021-10-12 ENCOUNTER — Telehealth: Payer: Self-pay

## 2021-10-12 NOTE — Telephone Encounter (Signed)
ERROR

## 2021-10-13 ENCOUNTER — Ambulatory Visit: Payer: BLUE CROSS/BLUE SHIELD | Admitting: Dermatology

## 2021-10-20 ENCOUNTER — Telehealth: Payer: Self-pay

## 2021-10-20 ENCOUNTER — Encounter: Payer: Self-pay | Admitting: Dermatology

## 2021-10-20 NOTE — Telephone Encounter (Signed)
Patient called one week ago demanding to be seen for another spot of ringworm that came up on her thigh and to follow up for acne since it has not improved. I let Dr. Neale Burly know and she recommended patient use topical terbinafine to the spot and if not improved in 1 week to call the office. I also told patient that acne can take at least 6-8 weeks before showing improvement. Patient had only been on treatment for 1 month so far. Patient has since cancelled all future appointments.Kimberly Ware

## 2021-11-01 ENCOUNTER — Other Ambulatory Visit (HOSPITAL_COMMUNITY)
Admission: RE | Admit: 2021-11-01 | Discharge: 2021-11-01 | Disposition: A | Discharge status: Home / Self Care | Payer: 59 | Source: Ambulatory Visit | Attending: Obstetrics and Gynecology | Admitting: Obstetrics and Gynecology

## 2021-11-01 ENCOUNTER — Ambulatory Visit (INDEPENDENT_AMBULATORY_CARE_PROVIDER_SITE_OTHER): Payer: 59 | Admitting: Obstetrics and Gynecology

## 2021-11-01 ENCOUNTER — Other Ambulatory Visit: Payer: Self-pay

## 2021-11-01 ENCOUNTER — Encounter: Payer: Self-pay | Admitting: Obstetrics and Gynecology

## 2021-11-01 VITALS — BP 109/75 | HR 85 | Ht 59.0 in | Wt 104.9 lb

## 2021-11-01 DIAGNOSIS — F32A Depression, unspecified: Secondary | ICD-10-CM

## 2021-11-01 DIAGNOSIS — E282 Polycystic ovarian syndrome: Secondary | ICD-10-CM | POA: Diagnosis not present

## 2021-11-01 DIAGNOSIS — Z113 Encounter for screening for infections with a predominantly sexual mode of transmission: Secondary | ICD-10-CM | POA: Insufficient documentation

## 2021-11-01 DIAGNOSIS — Z975 Presence of (intrauterine) contraceptive device: Secondary | ICD-10-CM | POA: Diagnosis not present

## 2021-11-01 DIAGNOSIS — N898 Other specified noninflammatory disorders of vagina: Secondary | ICD-10-CM

## 2021-11-01 DIAGNOSIS — F419 Anxiety disorder, unspecified: Secondary | ICD-10-CM

## 2021-11-01 DIAGNOSIS — Z01419 Encounter for gynecological examination (general) (routine) without abnormal findings: Secondary | ICD-10-CM | POA: Insufficient documentation

## 2021-11-01 NOTE — Progress Notes (Addendum)
GYNECOLOGY ANNUAL PHYSICAL EXAM PROGRESS NOTE  Subjective:    Kimberly Ware is a 20 y.o. G0P0000 female with a PMH of PCOS who presents to establish care, and for an annual exam. Is relocating from [place], [place].  The patient is not currently sexually active (however was active earlier this year). The patient participates in regular exercise: no. Has the patient ever been transfused or tattooed?: no. The patient reports that there is not domestic violence in her life.   The patient has the following complaints today: Patient has general questions regarding her IUD.  Currently has a [place] in place, placed in November 2018. Complains of vaginal odor for the past several months. Often has associated yellowish discharge. Denies vaginal itching or burning. Notes that she was last sexually active earlier in the year but currently is no longer active.  Desires to have STD screening. Wonders if there is something else that she can do for her hair growth (most concerning on the breast area).  Notes that her dermatologist had previously started her on spironolactone 2 mg.  Menstrual History: Menarche age: 20 Patient's last menstrual period was 11/01/2021 (exact date). Period Duration (Days): 7 Period Pattern: (!) Irregular Menstrual Flow: Light Menstrual Control: Panty liner Menstrual Control Change Freq (Hours): 2-3 Dysmenorrhea: (!) Mild Dysmenorrhea Symptoms: Cramping   Gynecologic History:  Contraception: IUD History of STI's: Denies Last Pap: cannot recall if she has had one.      Upstream - 11/01/21 1440       Pregnancy Intention Screening   Does the patient want to become pregnant in the next year? No    Does the patient's partner want to become pregnant in the next year? No    Would the patient like to discuss contraceptive options today? No      Contraception Wrap Up   Current Method IUD or IUS    End Method IUD or IUS    Contraception Counseling Provided No             The pregnancy intention screening data noted above was reviewed. Potential methods of contraception were discussed. The patient elected to proceed with IUD or IUS.     OB History  Gravida Para Term Preterm AB Living  0 0 0 0 0 0  SAB IAB Ectopic Multiple Live Births  0 0 0 0 0    Past Medical History:  Diagnosis Date   Abdominal pain, recurrent    Anxiety    Breast mass 09/2017   resolved 12/18; neg breast u/s   Chlamydia 10/2017   Depression    Family history of adverse reaction to anesthesia    Mother - PONV   Spastic colon     Past Surgical History:  Procedure Laterality Date   NO PAST SURGERIES     TONSILLECTOMY N/A 11/08/2016   Procedure: TONSILLECTOMY;  Surgeon: Geanie Logan, MD;  Location: [facility];  Service: ENT;  Laterality: N/A;    Family History  Problem Relation Age of Onset   Arthritis Sister    Breast cancer Maternal Grandmother 18   Cancer Father        father had colonoscopy and found a polyp    Social History   Socioeconomic History   Marital status: Single    Spouse name: Not on file   Number of children: Not on file   Years of education: Not on file   Highest education level: Not on file  Occupational History  Not on file  Tobacco Use   Smoking status: Never   Smokeless tobacco: Former  Scientific laboratory technician Use: Some days  Substance and Sexual Activity   Alcohol use: Yes    Comment: socially   Drug use: No   Sexual activity: Not Currently    Birth control/protection: I.U.D.    Comment: [place]  Other Topics Concern   Not on file  Social History Narrative   Starting 6th grade.   Social Determinants of Health   Financial Resource Strain: Not on file  Food Insecurity: Not on file  Transportation Needs: Not on file  Physical Activity: Not on file  Stress: Not on file  Social Connections: Not on file  Intimate Partner Violence: Not on file    Current Outpatient Medications on File Prior to Visit   Medication Sig Dispense Refill   ALPRAZolam (XANAX) 0.5 MG tablet Take 0.5 mg by mouth 2 (two) times daily as needed.     Minocycline HCl Micronized (AMZEEQ) 4 % FOAM Apply 1 application topically daily. Apply pea sized amount to entire face at night (after tretinoin on the nights she is using tretinoin) 30 g 1   SPIRONOLACTONE PO Take 50 mg by mouth. In am     Tazarotene (ARAZLO EX) Apply topically.     terbinafine (LAMISIL) 1 % cream Apply 1 application topically 2 (two) times daily. To affected area at left thigh until clear 28.4 g 1   triamcinolone cream (KENALOG) 0.1 % Apply 1 application topically 2 (two) times daily.     amphetamine-dextroamphetamine (ADDERALL) 20 MG tablet Take 20 mg by mouth 2 (two) times daily. (Patient not taking: Reported on 11/01/2021)     clindamycin-benzoyl peroxide (BENZACLIN) gel Apply topically every morning. Apply to face (Patient not taking: Reported on 10/07/2021) 50 g 2   hydrocortisone 2.5 % cream Apply topically 2 (two) times daily as needed (Rash). Twice daily for up to 2 weeks to rash at face. (Patient not taking: Reported on 10/07/2021) 30 g 1   mupirocin ointment (BACTROBAN) 2 % Apply 1 application topically 2 (two) times daily. Apply to chin.  Until healed. (Patient not taking: Reported on 11/01/2021) 22 g 0   tretinoin (RETIN-A) 0.025 % gel Apply topically at bedtime. (Patient not taking: Reported on 11/01/2021) 45 g 2   valACYclovir (VALTREX) 1000 MG tablet Take 2 tablets tonight, repeat tomorrow morning. (Patient not taking: Reported on 10/07/2021) 4 tablet 1   No current facility-administered medications on file prior to visit.    Allergies  Allergen Reactions   Ceclor [Cefaclor] Rash and Hives     Review of Systems Constitutional: negative for chills, fatigue, fevers and sweats Eyes: negative for irritation, redness and visual disturbance Ears, nose, mouth, throat, and face: negative for hearing loss, nasal congestion, snoring and  tinnitus Respiratory: negative for asthma, cough, sputum Cardiovascular: negative for chest pain, dyspnea, exertional chest pressure/discomfort, irregular heart beat, palpitations and syncope Gastrointestinal: negative for abdominal pain, change in bowel habits, nausea and vomiting Genitourinary: negative for abnormal menstrual periods, genital lesions, sexual problems and vaginal discharge, dysuria and urinary incontinence. Positive for vaginal odor and discharge for several months. Integument/breast: negative for breast lump, breast tenderness and nipple discharge Hematologic/lymphatic: negative for bleeding and easy bruising Musculoskeletal:negative for back pain and muscle weakness Neurological: negative for dizziness, headaches, vertigo and weakness Endocrine: negative for diabetic symptoms including polydipsia, polyuria and skin dryness Allergic/Immunologic: negative for hay fever and urticaria      Objective:  Blood pressure 109/75, pulse 85, height 4\' 11"  (1.499 m), weight 104 lb 14.4 oz (47.6 kg), last menstrual period 11/01/2021.  Body mass index is 21.19 kg/m.   General Appearance:    Alert, cooperative, no distress, appears stated age  Head:    Normocephalic, without obvious abnormality, atraumatic  Eyes:    PERRL, conjunctiva/corneas clear, EOM's intact, both eyes  Ears:    Normal external ear canals, both ears  Nose:   Nares normal, septum midline, mucosa normal, no drainage or sinus tenderness  Throat:   Lips, mucosa, and tongue normal; teeth and gums normal  Neck:   Supple, symmetrical, trachea midline, no adenopathy; thyroid: no enlargement/tenderness/nodules; no carotid bruit or JVD  Back:     Symmetric, no curvature, ROM normal, no CVA tenderness  Lungs:     Clear to auscultation bilaterally, respirations unlabored  Chest Wall:    No tenderness or deformity   Heart:    Regular rate and rhythm, S1 and S2 normal, no murmur, rub or gallop  Breast Exam:    No tenderness,  masses, or nipple abnormality  Abdomen:     Soft, non-tender, bowel sounds active all four quadrants, no masses, no organomegaly.    Genitalia:    Pelvic:external genitalia normal, vagina without lesions, scant brown thin discharge, no tenderness, rectovaginal septum  normal. Cervix normal in appearance, no cervical motion tenderness, IUD threads visible, ~ 3 cm in length.  No adnexal masses or tenderness.  Uterus normal size, shape, mobile, regular contours, nontender.  Rectal:    Normal external sphincter.  No hemorrhoids appreciated. Internal exam not done.   Extremities:   Extremities normal, atraumatic, no cyanosis or edema  Pulses:   2+ and symmetric all extremities  Skin:   Skin color, texture, turgor normal, no rashes or lesions  Lymph nodes:   Cervical, supraclavicular, and axillary nodes normal  Neurologic:   CNII-XII intact, normal strength, sensation and reflexes throughout    Depression screen PHQ 2/9 11/01/2021  Decreased Interest 2  Down, Depressed, Hopeless 3  PHQ - 2 Score 5  Altered sleeping 3  Tired, decreased energy 2  Change in appetite 1  Feeling bad or failure about yourself  3  Trouble concentrating 3  Moving slowly or fidgety/restless 1  Suicidal thoughts 0  PHQ-9 Score 18    GAD 7 : Generalized Anxiety Score 11/01/2021  Nervous, Anxious, on Edge 3  Control/stop worrying 3  Worry too much - different things 3  Trouble relaxing 1  Restless 1  Easily annoyed or irritable 3  Afraid - awful might happen 3  Total GAD 7 Score 17      Labs:  No results found for: WBC, HGB, HCT, MCV, PLT  No results found for: CREATININE, BUN, NA, K, CL, CO2  No results found for: ALT, AST, GGT, ALKPHOS, BILITOT  No results found for: TSH   Assessment:   1. Well woman exam with routine gynecological exam   2. Screen for STD (sexually transmitted disease)   3. PCOS (polycystic ovarian syndrome)   4. IUD (intrauterine device) in place   5. Vaginal odor   6.  Anxiety and depression      Plan:  - Blood tests: CBC with diff and Comprehensive metabolic panel. - Breast self exam technique reviewed and patient encouraged to perform self-exam monthly. - Contraception: IUD. Answered patient's questions regarding IUD. Due for removal in 1 year.  - Discussed healthy lifestyle modifications. - Pap smear ordered. - COVID  vaccination status: Declines - Flu vaccine: declines - Vaginal discharge with odor, cervical cultures performed. Given samples of boric acid suppositories until results return. - STD screening performed today at patient's request (declined serology screen). If prescriptions are needed, desires to be sent to a different pharmacy ([facility] on [address] in [place]).  Discussed other options for management for PCOS once IUD is removed. For now, advised that she could increase her Spironolactone dose if desired for her excessive hair growth.  Has done laser hair removal in the past for her arm hair growth.  - History of anxiety, currently sees a therapist.  GAD-7 and PHQ-9 scores elevated today. Notes that she has appointment tomorrow with her therapist. Uses Xanax prn. Discussed other management options, patient declines use of a daily medication.  Advised on natural supplements as well as encouraged exercising to help manage symptoms as well.  Patient ok to try these.   - Follow up in 1 year for annual exam    Rubie Maid, MD [facility]

## 2021-11-01 NOTE — Patient Instructions (Signed)
Preventive Care 5-21 Years Old, Female Preventive care refers to lifestyle choices and visits with your health care provider that can promote health and wellness. Preventive care visits are also called wellness exams. What can I expect for my preventive care visit? Counseling During your preventive care visit, your health care provider may ask about your: Medical history, including: Past medical problems. Family medical history. Pregnancy history. Current health, including: Menstrual cycle. Method of birth control. Emotional well-being. Home life and relationship well-being. Sexual activity and sexual health. Lifestyle, including: Alcohol, nicotine or tobacco, and drug use. Access to firearms. Diet, exercise, and sleep habits. Work and work Statistician. Sunscreen use. Safety issues such as seatbelt and bike helmet use. Physical exam Your health care provider may check your: Height and weight. These may be used to calculate your BMI (body mass index). BMI is a measurement that tells if you are at a healthy weight. Waist circumference. This measures the distance around your waistline. This measurement also tells if you are at a healthy weight and may help predict your risk of certain diseases, such as type 2 diabetes and high blood pressure. Heart rate and blood pressure. Body temperature. Skin for abnormal spots. What immunizations do I need? Vaccines are usually given at various ages, according to a schedule. Your health care provider will recommend vaccines for you based on your age, medical history, and lifestyle or other factors, such as travel or where you work. What tests do I need? Screening Your health care provider may recommend screening tests for certain conditions. This may include: Pelvic exam and Pap test. Lipid and cholesterol levels. Diabetes screening. This is done by checking your blood sugar (glucose) after you have not eaten for a while (fasting). Hepatitis B  test. Hepatitis C test. HIV (human immunodeficiency virus) test. STI (sexually transmitted infection) testing, if you are at risk. BRCA-related cancer screening. This may be done if you have a family history of breast, ovarian, tubal, or peritoneal cancers. Talk with your health care provider about your test results, treatment options, and if necessary, the need for more tests. Follow these instructions at home: Eating and drinking  Eat a healthy diet that includes fresh fruits and vegetables, whole grains, lean protein, and low-fat dairy products. Take vitamin and mineral supplements as recommended by your health care provider. Do not drink alcohol if: Your health care provider tells you not to drink. You are pregnant, may be pregnant, or are planning to become pregnant. If you drink alcohol: Limit how much you have to 0-1 drink a day. Know how much alcohol is in your drink. In the [place] one drink equals one 12 oz bottle of beer (355 mL), one 5 oz glass of wine (148 mL), or one 1 oz glass of hard liquor (44 mL). Lifestyle Brush your teeth every morning and night with fluoride toothpaste. Floss one time each day. Exercise for at least 30 minutes 5 or more days each week. Do not use any products that contain nicotine or tobacco. These products include cigarettes, chewing tobacco, and vaping devices, such as e-cigarettes. If you need help quitting, ask your health care provider. Do not use drugs. If you are sexually active, practice safe sex. Use a condom or other form of protection to prevent STIs. If you do not wish to become pregnant, use a form of birth control. If you plan to become pregnant, see your health care provider for a prepregnancy visit. Find healthy ways to manage stress, such as: Meditation, yoga,  or listening to music. °Journaling. °Talking to a trusted person. °Spending time with friends and family. °Minimize exposure to UV radiation to reduce your risk of skin  cancer. °Safety °Always wear your seat belt while driving or riding in a vehicle. °Do not drive: °If you have been drinking alcohol. Do not ride with someone who has been drinking. °If you have been using any mind-altering substances or drugs. °While texting. °When you are tired or distracted. °Wear a helmet and other protective equipment during sports activities. °If you have firearms in your house, make sure you follow all gun safety procedures. °Seek help if you have been physically or sexually abused. °What's next? °Go to your health care provider once a year for an annual wellness visit. °Ask your health care provider how often you should have your eyes and teeth checked. °Stay up to date on all vaccines. °This information is not intended to replace advice given to you by your health care provider. Make sure you discuss any questions you have with your health care provider. °Document Revised: 05/26/2021 Document Reviewed: 05/26/2021 °Elsevier Patient Education © 2022 [employer] °Breast Self-Awareness °Breast self-awareness is knowing how your breasts look and feel. Doing breast self-awareness is important. It allows you to catch a breast problem early while it is still small and can be treated. All women should do breast self-awareness, including women who have had breast implants. Tell your doctor if you notice a change in your breasts. °What you need: °A mirror. °A well-lit room. °How to do a breast self-exam °A breast self-exam is one way to learn what is normal for your breasts and to check for changes. To do a breast self-exam: °Look for changes ° °Take off all the clothes above your waist. °Stand in front of a mirror in a room with good lighting. °Put your hands on your hips. °Push your hands down. °Look at your breasts and nipples in the mirror to see if one breast or nipple looks different from the other. Check to see if: °The shape of one breast is different. °The size of one breast is  different. °There are wrinkles, dips, and bumps in one breast and not the other. °Look at each breast for changes in the skin, such as: °Redness. °Scaly areas. °Look for changes in your nipples, such as: °Liquid around the nipples. °Bleeding. °Dimpling. °Redness. °A change in where the nipples are. °Feel for changes ° °Lie on your back on the floor. °Feel each breast. To do this, follow these steps: °Pick a breast to feel. °Put the arm closest to that breast above your head. °Use your other arm to feel the nipple area of your breast. Feel the area with the pads of your three middle fingers by making small circles with your fingers. For the first circle, press lightly. For the second circle, press harder. For the third circle, press even harder. °Keep making circles with your fingers at the different pressures as you move down your breast. Stop when you feel your ribs. °Move your fingers a little toward the center of your body. °Start making circles with your fingers again, this time going up until you reach your collarbone. °Keep making up-and-down circles until you reach your armpit. Remember to keep using the three pressures. °Feel the other breast in the same way. °Sit or stand in the tub or shower. °With soapy water on your skin, feel each breast the same way you did in step 2 when you were lying on   the floor. °Write down what you find °Writing down what you find can help you remember what to tell your doctor. Write down: °What is normal for each breast. °Any changes you find in each breast, including: °The kind of changes you find. °Whether you have pain. °Size and location of any lumps. °When you last had your menstrual period. °General tips °Check your breasts every month. °If you are breastfeeding, the best time to check your breasts is after you feed your baby or after you use a breast pump. °If you get menstrual periods, the best time to check your breasts is 5-7 days after your menstrual period is  over. °With time, you will become comfortable with the self-exam, and you will begin to know if there are changes in your breasts. °Contact a doctor if you: °See a change in the shape or size of your breasts or nipples. °See a change in the skin of your breast or nipples, such as red or scaly skin. °Have fluid coming from your nipples that is not normal. °Find a lump or thick area that was not there before. °Have pain in your breasts. °Have any concerns about your breast health. °Summary °Breast self-awareness includes looking for changes in your breasts, as well as feeling for changes within your breasts. °Breast self-awareness should be done in front of a mirror in a well-lit room. °You should check your breasts every month. If you get menstrual periods, the best time to check your breasts is 5-7 days after your menstrual period is over. °Let your doctor know of any changes you see in your breasts, including changes in size, changes on the skin, pain or tenderness, or fluid from your nipples that is not normal. °This information is not intended to replace advice given to you by your health care provider. Make sure you discuss any questions you have with your health care provider. °Document Revised: 07/17/2018 Document Reviewed: 07/17/2018 °Elsevier Patient Education © 2022 [employer] Inc[employer] ° °

## 2021-11-02 ENCOUNTER — Encounter: Payer: Self-pay | Admitting: Obstetrics and Gynecology

## 2021-11-02 LAB — CERVICOVAGINAL ANCILLARY ONLY
Bacterial Vaginitis (gardnerella): POSITIVE — AB
Candida Glabrata: NEGATIVE
Candida Vaginitis: NEGATIVE
Chlamydia: NEGATIVE
Comment: NEGATIVE
Comment: NEGATIVE
Comment: NEGATIVE
Comment: NEGATIVE
Comment: NEGATIVE
Comment: NORMAL
Neisseria Gonorrhea: NEGATIVE
Trichomonas: NEGATIVE

## 2021-11-02 LAB — CBC
Hematocrit: 42.8 % (ref 34.0–46.6)
Hemoglobin: 14.6 g/dL (ref 11.1–15.9)
MCH: 30.9 pg (ref 26.6–33.0)
MCHC: 34.1 g/dL (ref 31.5–35.7)
MCV: 91 fL (ref 79–97)
Platelets: 235 10*3/uL (ref 150–450)
RBC: 4.73 x10E6/uL (ref 3.77–5.28)
RDW: 11.6 % — ABNORMAL LOW (ref 11.7–15.4)
WBC: 7.4 10*3/uL (ref 3.4–10.8)

## 2021-11-02 LAB — COMPREHENSIVE METABOLIC PANEL
ALT: 14 IU/L (ref 0–32)
AST: 21 IU/L (ref 0–40)
Albumin/Globulin Ratio: 3.1 — ABNORMAL HIGH (ref 1.2–2.2)
Albumin: 5 g/dL (ref 3.9–5.0)
Alkaline Phosphatase: 51 IU/L (ref 44–121)
BUN/Creatinine Ratio: 18 (ref 9–23)
BUN: 13 mg/dL (ref 6–20)
Bilirubin Total: 1.5 mg/dL — ABNORMAL HIGH (ref 0.0–1.2)
CO2: 23 mmol/L (ref 20–29)
Calcium: 9.8 mg/dL (ref 8.7–10.2)
Chloride: 102 mmol/L (ref 96–106)
Creatinine, Ser: 0.72 mg/dL (ref 0.57–1.00)
Globulin, Total: 1.6 g/dL (ref 1.5–4.5)
Glucose: 61 mg/dL — ABNORMAL LOW (ref 70–99)
Potassium: 5 mmol/L (ref 3.5–5.2)
Sodium: 139 mmol/L (ref 134–144)
Total Protein: 6.6 g/dL (ref 6.0–8.5)
eGFR: 122 mL/min/{1.73_m2} (ref >59)

## 2021-11-02 MED ORDER — METRONIDAZOLE 500 MG PO TABS: 500.0000 mg | ORAL_TABLET | Freq: Two times a day (BID) | ORAL | 0 refills | Status: DC

## 2021-11-08 LAB — CYTOLOGY - PAP: Diagnosis: NEGATIVE

## 2021-11-11 ENCOUNTER — Ambulatory Visit: Payer: Self-pay | Admitting: Dermatology

## 2021-12-16 ENCOUNTER — Ambulatory Visit: Payer: BLUE CROSS/BLUE SHIELD | Admitting: Dermatology

## 2022-01-14 DIAGNOSIS — L249 Irritant contact dermatitis, unspecified cause: Secondary | ICD-10-CM | POA: Diagnosis not present

## 2022-01-14 DIAGNOSIS — L74510 Primary focal hyperhidrosis, axilla: Secondary | ICD-10-CM | POA: Diagnosis not present

## 2022-02-15 DIAGNOSIS — Z79899 Other long term (current) drug therapy: Secondary | ICD-10-CM | POA: Diagnosis not present

## 2022-02-15 DIAGNOSIS — L74510 Primary focal hyperhidrosis, axilla: Secondary | ICD-10-CM | POA: Diagnosis not present

## 2022-02-15 DIAGNOSIS — L700 Acne vulgaris: Secondary | ICD-10-CM | POA: Diagnosis not present

## 2022-05-16 DIAGNOSIS — Z1389 Encounter for screening for other disorder: Secondary | ICD-10-CM | POA: Diagnosis not present

## 2022-05-16 DIAGNOSIS — R69 Illness, unspecified: Secondary | ICD-10-CM | POA: Diagnosis not present

## 2022-05-16 DIAGNOSIS — F4321 Adjustment disorder with depressed mood: Secondary | ICD-10-CM | POA: Diagnosis not present

## 2022-05-16 DIAGNOSIS — L700 Acne vulgaris: Secondary | ICD-10-CM | POA: Diagnosis not present

## 2022-05-16 DIAGNOSIS — E282 Polycystic ovarian syndrome: Secondary | ICD-10-CM | POA: Diagnosis not present

## 2022-06-03 DIAGNOSIS — R1031 Right lower quadrant pain: Secondary | ICD-10-CM | POA: Diagnosis not present

## 2022-06-03 DIAGNOSIS — Z6820 Body mass index (BMI) 20.0-20.9, adult: Secondary | ICD-10-CM | POA: Diagnosis not present

## 2022-06-03 DIAGNOSIS — J9801 Acute bronchospasm: Secondary | ICD-10-CM | POA: Diagnosis not present

## 2022-07-05 DIAGNOSIS — E538 Deficiency of other specified B group vitamins: Secondary | ICD-10-CM | POA: Diagnosis not present

## 2022-07-05 DIAGNOSIS — D509 Iron deficiency anemia, unspecified: Secondary | ICD-10-CM | POA: Diagnosis not present

## 2022-07-05 DIAGNOSIS — E559 Vitamin D deficiency, unspecified: Secondary | ICD-10-CM | POA: Diagnosis not present

## 2022-07-05 DIAGNOSIS — E282 Polycystic ovarian syndrome: Secondary | ICD-10-CM | POA: Diagnosis not present

## 2022-07-05 DIAGNOSIS — R5383 Other fatigue: Secondary | ICD-10-CM | POA: Diagnosis not present

## 2022-07-05 DIAGNOSIS — R5382 Chronic fatigue, unspecified: Secondary | ICD-10-CM | POA: Diagnosis not present

## 2022-07-05 DIAGNOSIS — E611 Iron deficiency: Secondary | ICD-10-CM | POA: Diagnosis not present

## 2022-08-18 DIAGNOSIS — L74510 Primary focal hyperhidrosis, axilla: Secondary | ICD-10-CM | POA: Diagnosis not present

## 2022-08-18 DIAGNOSIS — Z79899 Other long term (current) drug therapy: Secondary | ICD-10-CM | POA: Diagnosis not present

## 2022-08-18 DIAGNOSIS — L700 Acne vulgaris: Secondary | ICD-10-CM | POA: Diagnosis not present

## 2022-08-18 DIAGNOSIS — D2372 Other benign neoplasm of skin of left lower limb, including hip: Secondary | ICD-10-CM | POA: Diagnosis not present

## 2022-09-07 DIAGNOSIS — R69 Illness, unspecified: Secondary | ICD-10-CM | POA: Diagnosis not present

## 2022-09-07 DIAGNOSIS — E282 Polycystic ovarian syndrome: Secondary | ICD-10-CM | POA: Diagnosis not present

## 2022-09-07 DIAGNOSIS — J329 Chronic sinusitis, unspecified: Secondary | ICD-10-CM | POA: Diagnosis not present

## 2022-09-07 DIAGNOSIS — Z6821 Body mass index (BMI) 21.0-21.9, adult: Secondary | ICD-10-CM | POA: Diagnosis not present

## 2022-10-31 NOTE — Patient Instructions (Incomplete)

## 2022-10-31 NOTE — Progress Notes (Unsigned)
GYNECOLOGY ANNUAL PHYSICAL EXAM PROGRESS NOTE  Subjective:    Kimberly Ware is a 21 y.o. G0P0000 female who presents for an annual exam. The patient has no complaints today. The patient {is/is not/has never been:13135} sexually active. The patient participates in regular exercise: {yes/no/not asked:9010}. Has the patient ever been transfused or tattooed?: {yes/no/not asked:9010}. The patient reports that there {is/is not:9024} domestic violence in her life.    Menstrual History: Menarche age: *** No LMP recorded. (Menstrual status: IUD).     Gynecologic History:  Contraception: {method:5051} History of STI's:  Last Pap: ***. Results were: {norm/abn:16337}.  ***Denies/Notes h/o abnormal pap smears. Last mammogram: ***. Results were: {norm/abn:16337}       OB History  Gravida Para Term Preterm AB Living  0 0 0 0 0 0  SAB IAB Ectopic Multiple Live Births  0 0 0 0 0    Past Medical History:  Diagnosis Date   Abdominal pain, recurrent    Anxiety    Breast mass 09/2017   resolved 12/18; neg breast u/s   Chlamydia 10/2017   Depression    Family history of adverse reaction to anesthesia    Mother - PONV   Spastic colon     Past Surgical History:  Procedure Laterality Date   NO PAST SURGERIES     TONSILLECTOMY N/A 11/08/2016   Procedure: TONSILLECTOMY;  Surgeon: Geanie Logan, MD;  Location: [facility];  Service: ENT;  Laterality: N/A;    Family History  Problem Relation Age of Onset   Arthritis Sister    Breast cancer Maternal Grandmother 105   Cancer Father        father had colonoscopy and found a polyp    Social History   Socioeconomic History   Marital status: Single    Spouse name: Not on file   Number of children: Not on file   Years of education: Not on file   Highest education level: Not on file  Occupational History   Not on file  Tobacco Use   Smoking status: Never   Smokeless tobacco: Former  Building services engineer Use: Some days   Substance and Sexual Activity   Alcohol use: Yes    Comment: socially   Drug use: No   Sexual activity: Not Currently    Birth control/protection: I.U.D.    Comment: Kyleena  Other Topics Concern   Not on file  Social History Narrative   Starting 6th grade.   Social Determinants of Health   Financial Resource Strain: Not on file  Food Insecurity: Not on file  Transportation Needs: Not on file  Physical Activity: Sufficiently Active (12/25/2018)   Exercise Vital Sign    Days of Exercise per Week: 7 days    Minutes of Exercise per Session: 120 min  Stress: No Stress Concern Present (12/25/2018)   [employer] of Occupational Health - Occupational Stress Questionnaire    Feeling of Stress : Not at all  Social Connections: Moderately Integrated (12/25/2018)   Social Connection and Isolation Panel [NHANES]    Frequency of Communication with Friends and Family: More than three times a week    Frequency of Social Gatherings with Friends and Family: More than three times a week    Attends Religious Services: 1 to 4 times per year    Active Member of [employer] or Organizations: Yes    Attends Banker Meetings: More than 4 times per year    Marital Status: Never  married  Intimate Partner Violence: Not At Risk (12/25/2018)   Humiliation, Afraid, Rape, and Kick questionnaire    Fear of Current or Ex-Partner: No    Emotionally Abused: No    Physically Abused: No    Sexually Abused: No    Current Outpatient Medications on File Prior to Visit  Medication Sig Dispense Refill   ALPRAZolam (XANAX) 0.5 MG tablet Take 0.5 mg by mouth 2 (two) times daily as needed.     metroNIDAZOLE (FLAGYL) 500 MG tablet Take 1 tablet (500 mg total) by mouth 2 (two) times daily. 14 tablet 0   Minocycline HCl Micronized (AMZEEQ) 4 % FOAM Apply 1 application topically daily. Apply pea sized amount to entire face at night (after tretinoin on the nights she is using tretinoin) 30 g 1   SPIRONOLACTONE  PO Take 50 mg by mouth. In am     Tazarotene (ARAZLO EX) Apply topically.     terbinafine (LAMISIL) 1 % cream Apply 1 application topically 2 (two) times daily. To affected area at left thigh until clear 28.4 g 1   triamcinolone cream (KENALOG) 0.1 % Apply 1 application topically 2 (two) times daily.     valACYclovir (VALTREX) 1000 MG tablet Take 2 tablets tonight, repeat tomorrow morning. (Patient not taking: Reported on 10/07/2021) 4 tablet 1   No current facility-administered medications on file prior to visit.    Allergies  Allergen Reactions   Ceclor [Cefaclor] Rash and Hives     Review of Systems Constitutional: negative for chills, fatigue, fevers and sweats Eyes: negative for irritation, redness and visual disturbance Ears, nose, mouth, throat, and face: negative for hearing loss, nasal congestion, snoring and tinnitus Respiratory: negative for asthma, cough, sputum Cardiovascular: negative for chest pain, dyspnea, exertional chest pressure/discomfort, irregular heart beat, palpitations and syncope Gastrointestinal: negative for abdominal pain, change in bowel habits, nausea and vomiting Genitourinary: negative for abnormal menstrual periods, genital lesions, sexual problems and vaginal discharge, dysuria and urinary incontinence Integument/breast: negative for breast lump, breast tenderness and nipple discharge Hematologic/lymphatic: negative for bleeding and easy bruising Musculoskeletal:negative for back pain and muscle weakness Neurological: negative for dizziness, headaches, vertigo and weakness Endocrine: negative for diabetic symptoms including polydipsia, polyuria and skin dryness Allergic/Immunologic: negative for hay fever and urticaria      Objective:  There were no vitals taken for this visit. There is no height or weight on file to calculate BMI.    General Appearance:    Alert, cooperative, no distress, appears stated age  Head:    Normocephalic, without  obvious abnormality, atraumatic  Eyes:    PERRL, conjunctiva/corneas clear, EOM's intact, both eyes  Ears:    Normal external ear canals, both ears  Nose:   Nares normal, septum midline, mucosa normal, no drainage or sinus tenderness  Throat:   Lips, mucosa, and tongue normal; teeth and gums normal  Neck:   Supple, symmetrical, trachea midline, no adenopathy; thyroid: no enlargement/tenderness/nodules; no carotid bruit or JVD  Back:     Symmetric, no curvature, ROM normal, no CVA tenderness  Lungs:     Clear to auscultation bilaterally, respirations unlabored  Chest Wall:    No tenderness or deformity   Heart:    Regular rate and rhythm, S1 and S2 normal, no murmur, rub or gallop  Breast Exam:    No tenderness, masses, or nipple abnormality  Abdomen:     Soft, non-tender, bowel sounds active all four quadrants, no masses, no organomegaly.    Genitalia:  Pelvic:external genitalia normal, vagina without lesions, discharge, or tenderness, rectovaginal septum  normal. Cervix normal in appearance, no cervical motion tenderness, no adnexal masses or tenderness.  Uterus normal size, shape, mobile, regular contours, nontender.  Rectal:    Normal external sphincter.  No hemorrhoids appreciated. Internal exam not done.   Extremities:   Extremities normal, atraumatic, no cyanosis or edema  Pulses:   2+ and symmetric all extremities  Skin:   Skin color, texture, turgor normal, no rashes or lesions  Lymph nodes:   Cervical, supraclavicular, and axillary nodes normal  Neurologic:   CNII-XII intact, normal strength, sensation and reflexes throughout   .  Labs:  Lab Results  Component Value Date   WBC 7.4 11/01/2021   HGB 14.6 11/01/2021   HCT 42.8 11/01/2021   MCV 91 11/01/2021   PLT 235 11/01/2021    Lab Results  Component Value Date   CREATININE 0.72 11/01/2021   BUN 13 11/01/2021   NA 139 11/01/2021   K 5.0 11/01/2021   CL 102 11/01/2021   CO2 23 11/01/2021    Lab Results  Component  Value Date   ALT 14 11/01/2021   AST 21 11/01/2021   ALKPHOS 51 11/01/2021   BILITOT 1.5 (H) 11/01/2021    No results found for: "TSH"   Assessment:   No diagnosis found.   Plan:  Blood tests: {blood tests:13147}. Breast self exam technique reviewed and patient encouraged to perform self-exam monthly. Contraception: {contraceptive methods:5051}. Discussed healthy lifestyle modifications. Mammogram  : Not age appropriate Pap smear  UTD . COVID vaccination status: Follow up in 1 year for annual exam         GYNECOLOGY OFFICE PROCEDURE NOTE  Kimberly Ware is a 21 y.o. G0P0000 here for Liletta IUD removal. No GYN concerns.  Last pap smear was on 11/01/2021 and was normal.  IUD Removal  Patient identified, informed consent performed, consent signed.  Patient was in the dorsal lithotomy position, normal external genitalia was noted.  A speculum was placed in the patient's vagina, normal discharge was noted, no lesions. The cervix was visualized, no lesions, no abnormal discharge.  The strings of the IUD were grasped and pulled using ring forceps. The IUD was removed in its entirety. *** (The strings of the IUD were not visualized, so Kelly forceps were introduced into the endometrial cavity and the IUD was grasped and removed in its entirety).  Patient tolerated the procedure well.    Patient will use *** for contraception/***plans for pregnancy soon and she was told to avoid teratogens, take PNV and folic acid.  Routine preventative health maintenance measures emphasized.    Hildred Laser, MD [facility]

## 2022-11-01 ENCOUNTER — Ambulatory Visit: Payer: 59 | Admitting: Obstetrics and Gynecology

## 2022-11-01 ENCOUNTER — Encounter: Payer: Self-pay | Admitting: Obstetrics and Gynecology

## 2022-11-01 VITALS — BP 111/66 | HR 94 | Resp 16 | Ht 60.0 in | Wt 112.6 lb

## 2022-11-01 DIAGNOSIS — Z30432 Encounter for removal of intrauterine contraceptive device: Secondary | ICD-10-CM | POA: Diagnosis not present

## 2022-11-01 DIAGNOSIS — E282 Polycystic ovarian syndrome: Secondary | ICD-10-CM | POA: Diagnosis not present

## 2022-11-01 DIAGNOSIS — Z01419 Encounter for gynecological examination (general) (routine) without abnormal findings: Secondary | ICD-10-CM

## 2022-12-23 DIAGNOSIS — J069 Acute upper respiratory infection, unspecified: Secondary | ICD-10-CM | POA: Diagnosis not present

## 2022-12-23 DIAGNOSIS — Z6822 Body mass index (BMI) 22.0-22.9, adult: Secondary | ICD-10-CM | POA: Diagnosis not present

## 2023-02-24 DIAGNOSIS — L74510 Primary focal hyperhidrosis, axilla: Secondary | ICD-10-CM | POA: Diagnosis not present

## 2023-02-24 DIAGNOSIS — D2372 Other benign neoplasm of skin of left lower limb, including hip: Secondary | ICD-10-CM | POA: Diagnosis not present

## 2023-02-24 DIAGNOSIS — L700 Acne vulgaris: Secondary | ICD-10-CM | POA: Diagnosis not present

## 2023-04-18 DIAGNOSIS — J301 Allergic rhinitis due to pollen: Secondary | ICD-10-CM | POA: Diagnosis not present

## 2023-11-03 ENCOUNTER — Ambulatory Visit
Admission: EM | Admit: 2023-11-03 | Discharge: 2023-11-03 | Disposition: A | Discharge status: Home / Self Care | Payer: 59 | Attending: Emergency Medicine | Admitting: Emergency Medicine

## 2023-11-03 DIAGNOSIS — J0100 Acute maxillary sinusitis, unspecified: Secondary | ICD-10-CM | POA: Diagnosis not present

## 2023-11-03 MED ORDER — AMOXICILLIN-POT CLAVULANATE 875-125 MG PO TABS: 1.0000 | ORAL_TABLET | Freq: Two times a day (BID) | ORAL | 0 refills | Status: AC

## 2023-11-03 MED ORDER — PREDNISONE 10 MG (21) PO TBPK: ORAL_TABLET | Freq: Every day | ORAL | 0 refills | Status: DC

## 2023-11-03 NOTE — Discharge Instructions (Signed)
You are being treated for sinus infection, based on research, sinus infections are caused by viruses and after prolonged period of time they are considered to be bacterial, typically viruses can take up to 7 to 10 days before you start to see improvement, if no improvement in your symptoms have been seen by Wednesday, November 20 17 may begin use of Augmentin   In the meantime begin use of prednisone as directed to reduce pressure within the sinus cavity, take with food    You can take Tylenol and/or Ibuprofen as needed for fever reduction and pain relief.   For cough: honey 1/2 to 1 teaspoon (you can dilute the honey in water or another fluid).  You can also use guaifenesin and dextromethorphan for cough. You can use a humidifier for chest congestion and cough.  If you don't have a humidifier, you can sit in the bathroom with the hot shower running.      For sore throat: try warm salt water gargles, cepacol lozenges, throat spray, warm tea or water with lemon/honey, popsicles or ice, or OTC cold relief medicine for throat discomfort.   For congestion: take a daily anti-histamine like Zyrtec, Claritin, and a oral decongestant, such as pseudoephedrine.  You can also use Flonase 1-2 sprays in each nostril daily.   It is important to stay hydrated: drink plenty of fluids (water, gatorade/powerade/pedialyte, juices, or teas) to keep your throat moisturized and help further relieve irritation/discomfort.

## 2023-11-03 NOTE — ED Triage Notes (Signed)
Cough, sinus pain and pressure, runny nose x 4 days. Taking benadryl, tylenol sinus and zicam with no relief.

## 2023-11-03 NOTE — ED Provider Notes (Signed)
Kimberly Ware    CSN: [id number] Arrival date & time: 11/03/23  1211      History   Chief Complaint Chief Complaint  Patient presents with   Cough    HPI Kimberly Ware is a 22 y.o. female.   Presents for evaluation of nasal congestion, rhinorrhea, sneezing, bilateral ear fullness, sinus pressure along the cheeks radiating into the jaw present for 4 days.  No known sick contacts prior.  Tolerating food and liquids.  Has attempted use of zinc, oregano oil, antihistamines, Zicam and Tylenol cold and flu.  Nuys presence of fever, chills, body aches, sore throat.  Past Medical History:  Diagnosis Date   Abdominal pain, recurrent    Anxiety    Breast mass 09/2017   resolved 12/18; neg breast u/s   Chlamydia 10/2017   Depression    Family history of adverse reaction to anesthesia    Mother - PONV   Spastic colon     Patient Active Problem List   Diagnosis Date Noted   Chlamydia 11/09/2017   Irritable bowel syndrome (IBS) 08/30/2011   Diarrhea in pediatric patient 08/18/2011   Abdominal pain, recurrent     Past Surgical History:  Procedure Laterality Date   NO PAST SURGERIES     TONSILLECTOMY N/A 11/08/2016   Procedure: TONSILLECTOMY;  Surgeon: Geanie Logan, MD;  Location: [facility];  Service: ENT;  Laterality: N/A;    OB History     Gravida  0   Para  0   Term  0   Preterm  0   AB  0   Living  0      SAB  0   IAB  0   Ectopic  0   Multiple  0   Live Births  0            Home Medications    Prior to Admission medications   Medication Sig Start Date End Date Taking? Authorizing Provider  ALPRAZolam Prudy Feeler) 0.5 MG tablet Take 0.5 mg by mouth 2 (two) times daily as needed. 06/23/21  Yes [provider]  amoxicillin-clavulanate (AUGMENTIN) 875-125 MG tablet Take 1 tablet by mouth every 12 (twelve) hours for 10 days. 11/08/23 11/18/23 Yes Kaileb Monsanto R, NP  predniSONE (STERAPRED UNI-PAK 21 TAB) 10 MG (21) TBPK  tablet Take by mouth daily. Take 6 tabs by mouth daily  for 1 days, then 5 tabs for 1 days, then 4 tabs for 1 days, then 3 tabs for 1 days, 2 tabs for 1 days, then 1 tab by mouth daily for 1 days 11/03/23  Yes Kataleah Bejar R, NP  Minocycline HCl Micronized (AMZEEQ) 4 % FOAM Apply 1 application topically daily. Apply pea sized amount to entire face at night (after tretinoin on the nights she is using tretinoin) 09/21/21   M[place], [place], MD  spironolactone (ALDACTONE) 25 MG tablet Take 25 mg by mouth daily. 08/18/22   [provider]  Tazarotene (ARAZLO EX) Apply topically.    [provider]    Family History Family History  Problem Relation Age of Onset   Arthritis Sister    Breast cancer Maternal Grandmother 82   Cancer Father        father had colonoscopy and found a polyp    Social History Social History   Tobacco Use   Smoking status: Never   Smokeless tobacco: Former  Building services engineer status: Some Days  Substance Use Topics   Alcohol  use: Yes    Comment: socially   Drug use: No     Allergies   Ceclor [cefaclor]   Review of Systems Review of Systems   Physical Exam Triage Vital Signs ED Triage Vitals  Encounter Vitals Group     BP 11/03/23 1233 101/70     Systolic BP Percentile --      Diastolic BP Percentile --      Pulse Rate 11/03/23 1233 96     Resp 11/03/23 1233 16     Temp 11/03/23 1233 98.3 F (36.8 C)     Temp Source 11/03/23 1233 Oral     SpO2 11/03/23 1233 98 %     Weight --      Height --      Head Circumference --      Peak Flow --      Pain Score 11/03/23 1237 5     Pain Loc --      Pain Education --      Exclude from Growth Chart --    No data found.  Updated Vital Signs BP 101/70 (BP Location: Left Arm)   Pulse 96   Temp 98.3 F (36.8 C) (Oral)   Resp 16   LMP 11/03/2023 (Exact Date)   SpO2 98%   Visual Acuity Right Eye Distance:   Left Eye Distance:   Bilateral Distance:    Right Eye Near:   Left  Eye Near:    Bilateral Near:     Physical Exam Constitutional:      Appearance: Normal appearance.  HENT:     Head: Normocephalic.     Right Ear: A middle ear effusion is present.     Left Ear: A middle ear effusion is present.     Nose: Congestion and rhinorrhea present.     Right Sinus: Maxillary sinus tenderness present. No frontal sinus tenderness.     Left Sinus: Maxillary sinus tenderness present. No frontal sinus tenderness.     Mouth/Throat:     Pharynx: No oropharyngeal exudate or posterior oropharyngeal erythema.  Cardiovascular:     Rate and Rhythm: Normal rate and regular rhythm.     Pulses: Normal pulses.     Heart sounds: Normal heart sounds.  Pulmonary:     Effort: Pulmonary effort is normal.     Breath sounds: Normal breath sounds.  Neurological:     Mental Status: She is alert and oriented to person, place, and time. Mental status is at baseline.      UC Treatments / Results  Labs (all labs ordered are listed, but only abnormal results are displayed) Labs Reviewed - No data to display  EKG   Radiology No results found.  Procedures Procedures (including critical care time)  Medications Ordered in UC Medications - No data to display  Initial Impression / Assessment and Plan / UC Course  I have reviewed the triage vital signs and the nursing notes.  Pertinent labs & imaging results that were available during my care of the patient were reviewed by me and considered in my medical decision making (see chart for details).  Acute nonrecurrent maxillary sinusitis  Patient is in no signs of distress nor toxic appearing.  Vital signs are stable.  Low suspicion for pneumonia, pneumothorax or bronchitis and therefore will defer imaging.  Presentation consistent with a sinusitis however symptoms present for 4 days therefore most likely viral, discussed this with patient.  Prescribed prednisone and recommended continue supportive care, watchful wait antibiotic  placed at pharmacy if no improvement seen, prescribed Augmentin.May use additional over-the-counter medications as needed for supportive care.  May follow-up with urgent care as needed if symptoms persist or worsen.   Final Clinical Impressions(s) / UC Diagnoses   Final diagnoses:  Acute non-recurrent maxillary sinusitis     Discharge Instructions      You are being treated for sinus infection, based on research, sinus infections are caused by viruses and after prolonged period of time they are considered to be bacterial, typically viruses can take up to 7 to 10 days before you start to see improvement, if no improvement in your symptoms have been seen by Wednesday, November 20 17 may begin use of Augmentin   In the meantime begin use of prednisone as directed to reduce pressure within the sinus cavity, take with food    You can take Tylenol and/or Ibuprofen as needed for fever reduction and pain relief.   For cough: honey 1/2 to 1 teaspoon (you can dilute the honey in water or another fluid).  You can also use guaifenesin and dextromethorphan for cough. You can use a humidifier for chest congestion and cough.  If you don't have a humidifier, you can sit in the bathroom with the hot shower running.      For sore throat: try warm salt water gargles, cepacol lozenges, throat spray, warm tea or water with lemon/honey, popsicles or ice, or OTC cold relief medicine for throat discomfort.   For congestion: take a daily anti-histamine like Zyrtec, Claritin, and a oral decongestant, such as pseudoephedrine.  You can also use Flonase 1-2 sprays in each nostril daily.   It is important to stay hydrated: drink plenty of fluids (water, gatorade/powerade/pedialyte, juices, or teas) to keep your throat moisturized and help further relieve irritation/discomfort.    ED Prescriptions     Medication Sig Dispense Auth. Provider   predniSONE (STERAPRED UNI-PAK 21 TAB) 10 MG (21) TBPK tablet Take by mouth  daily. Take 6 tabs by mouth daily  for 1 days, then 5 tabs for 1 days, then 4 tabs for 1 days, then 3 tabs for 1 days, 2 tabs for 1 days, then 1 tab by mouth daily for 1 days 21 tablet Corbin Hott R, NP   amoxicillin-clavulanate (AUGMENTIN) 875-125 MG tablet Take 1 tablet by mouth every 12 (twelve) hours for 10 days. 20 tablet Valinda Hoar, NP      PDMP not reviewed this encounter.   Valinda Hoar, NP 11/03/23 1324

## 2024-01-03 ENCOUNTER — Ambulatory Visit (INDEPENDENT_AMBULATORY_CARE_PROVIDER_SITE_OTHER): Payer: 59 | Admitting: Family Medicine

## 2024-01-03 ENCOUNTER — Encounter: Payer: Self-pay | Admitting: Family Medicine

## 2024-01-03 VITALS — BP 104/64 | HR 70 | Temp 97.4°F | Resp 12 | Ht 60.0 in | Wt 123.8 lb

## 2024-01-03 DIAGNOSIS — E282 Polycystic ovarian syndrome: Secondary | ICD-10-CM | POA: Insufficient documentation

## 2024-01-03 DIAGNOSIS — F419 Anxiety disorder, unspecified: Secondary | ICD-10-CM | POA: Diagnosis not present

## 2024-01-03 NOTE — Assessment & Plan Note (Signed)
Working on cognitive behavioral therapy to stay calm.  Has Xanax as a backup if she needs it.

## 2024-01-03 NOTE — Progress Notes (Signed)
   Established Patient Office Visit  Subjective   Patient ID: Kimberly Ware, female    DOB: 09-17-2001  Age: 23 y.o. MRN: [id number]  Chief Complaint  Patient presents with   Establish Care   Medical Management of Chronic Issues    HPI She is doing well.  She has an old prescription of adderall and takes maybe 1 a month.  She has an old prescription of xanax and takes maybe one a month..  She is doing well with her anxiety.  She can calm herself down when she gets nervous.  PHQ-9 is 8, GAD-7 is 3.  She is not working with a therapist right now. 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  Discussed DTAP but she declined.  Also offered the flu vaccine and she declined this as well.   Saw Dr. Valentino Saxon 11/23 had her IUD removed and had a Pap smear.  She does not want contraception at this time.  She was diagnosed with PCOS and is not taking medication for that at this time. 11/24 went to the emergency department with a sinus infection.  Is doing fine now    ROS    Objective:     BP 104/64 (BP Location: Right Arm, Patient Position: Sitting, Cuff Size: Normal)   Pulse 70   Temp (!) 97.4 F (36.3 C) (Oral)   Resp 12   Ht 5' (1.524 m) Comment: per patient  Wt 123 lb 12.8 oz (56.2 kg)   SpO2 96%   BMI 24.18 kg/m    Physical Exam Vitals and nursing note reviewed.  Constitutional:      Appearance: Normal appearance.  HENT:     Head: Normocephalic and atraumatic.  Eyes:     Conjunctiva/sclera: Conjunctivae normal.  Cardiovascular:     Rate and Rhythm: Normal rate and regular rhythm.  Pulmonary:     Effort: Pulmonary effort is normal.     Breath sounds: Normal breath sounds.  Musculoskeletal:     Right lower leg: No edema.     Left lower leg: No edema.  Skin:    General: Skin is warm and dry.  Neurological:     Mental Status: She is alert and oriented to person, place, and time.  Psychiatric:        Mood and Affect: Mood normal.        Behavior: Behavior normal.        Thought Content: Thought  content normal.        Judgment: Judgment normal.          No results found for any visits on 01/03/24.    The ASCVD Risk score (Arnett DK, et al., 2019) failed to calculate for the following reasons:   The 2019 ASCVD risk score is only valid for ages 76 to 98    Assessment & Plan:  Anxiety Assessment & Plan: Working on cognitive behavioral therapy to stay calm.  Has Xanax as a backup if she needs it.   PCOS (polycystic ovarian syndrome) Assessment & Plan: Not on any particular therapy for this at this time.      Return in about 6 months (around 07/02/2024).    Alease Medina, MD

## 2024-01-03 NOTE — Assessment & Plan Note (Signed)
Not on any particular therapy for this at this time.

## 2024-09-05 ENCOUNTER — Ambulatory Visit
Admission: EM | Admit: 2024-09-05 | Discharge: 2024-09-05 | Disposition: A | Discharge status: Home / Self Care | Attending: Emergency Medicine | Admitting: Emergency Medicine

## 2024-09-05 DIAGNOSIS — J029 Acute pharyngitis, unspecified: Secondary | ICD-10-CM | POA: Diagnosis not present

## 2024-09-05 LAB — POCT RAPID STREP A (OFFICE): Rapid Strep A Screen: NEGATIVE

## 2024-09-05 NOTE — ED Provider Notes (Signed)
 Kimberly Ware    CSN: [id number] Arrival date & time: 09/05/24  1231      History   Chief Complaint Chief Complaint  Patient presents with   Sore Throat    HPI Kimberly Ware is a 23 y.o. female.  Patient presents with 2-day history of sore throat.  No fever, rash, cough, shortness of breath, vomiting, diarrhea.  Treatment attempted with Mucinex and oregano oil.  Patient reports possible exposure to strep throat.  The history is provided by the patient and medical records.    Past Medical History:  Diagnosis Date   Abdominal pain, recurrent    Anxiety    Breast mass 09/2017   resolved 12/18; neg breast u/s   Chlamydia 10/2017   Depression    Family history of adverse reaction to anesthesia    Mother - PONV   Spastic colon     Patient Active Problem List   Diagnosis Date Noted   Anxiety 01/03/2024   PCOS (polycystic ovarian syndrome) 01/03/2024   Acne vulgaris 06/07/2018   Electronic cigarette use 06/07/2018   Moderate major depression (HCC) 06/07/2018   Chlamydia 11/09/2017   Abdominal pain 11/27/2015   Hyperhidrosis 09/12/2014   Irritable bowel syndrome (IBS) 08/30/2011   Diarrhea in pediatric patient 08/18/2011   Abdominal pain, recurrent     Past Surgical History:  Procedure Laterality Date   NO PAST SURGERIES     TONSILLECTOMY N/A 11/08/2016   Procedure: TONSILLECTOMY;  Surgeon: Deward Dolly, MD;  Location: [facility];  Service: ENT;  Laterality: N/A;    OB History     Gravida  0   Para  0   Term  0   Preterm  0   AB  0   Living  0      SAB  0   IAB  0   Ectopic  0   Multiple  0   Live Births  0            Home Medications    Prior to Admission medications   Medication Sig Start Date End Date Taking? Authorizing Provider  amphetamine-dextroamphetamine (ADDERALL) 20 MG tablet Take 20 mg by mouth as needed.   Yes [provider]  ALPRAZolam (XANAX) 0.5 MG tablet Take 0.5 mg by mouth 2 (two) times  daily as needed. 06/23/21   [provider]  Minocycline  HCl Micronized (AMZEEQ ) 4 % FOAM Apply 1 application topically daily. Apply pea sized amount to entire face at night (after tretinoin  on the nights she is using tretinoin ) 09/21/21   Moye, [place] , MD  Tazarotene (ARAZLO EX) Apply topically.    [provider]    Family History Family History  Problem Relation Age of Onset   Arthritis Sister    Breast cancer Maternal Grandmother 8   Cancer Father        father had colonoscopy and found a polyp    Social History Social History   Tobacco Use   Smoking status: Never   Smokeless tobacco: Former  Building services engineer status: Some Days  Substance Use Topics   Alcohol use: Yes    Comment: socially   Drug use: No     Allergies   Ceclor [cefaclor]   Review of Systems Review of Systems  Constitutional:  Negative for chills and fever.  HENT:  Positive for sore throat. Negative for ear pain.   Respiratory:  Negative for cough and shortness of breath.   Gastrointestinal:  Negative for diarrhea and vomiting.  Skin:  Negative for color change and rash.     Physical Exam Triage Vital Signs ED Triage Vitals  Encounter Vitals Group     BP 09/05/24 1249 111/80     Girls Systolic BP Percentile --      Girls Diastolic BP Percentile --      Boys Systolic BP Percentile --      Boys Diastolic BP Percentile --      Pulse Rate 09/05/24 1249 86     Resp 09/05/24 1249 18     Temp 09/05/24 1249 98.9 F (37.2 C)     Temp src --      SpO2 09/05/24 1249 97 %     Weight --      Height --      Head Circumference --      Peak Flow --      Pain Score 09/05/24 1247 2     Pain Loc --      Pain Education --      Exclude from Growth Chart --    No data found.  Updated Vital Signs BP 111/80   Pulse 86   Temp 98.9 F (37.2 C)   Resp 18   LMP 08/25/2024   SpO2 97%   Visual Acuity Right Eye Distance:   Left Eye Distance:   Bilateral Distance:    Right  Eye Near:   Left Eye Near:    Bilateral Near:     Physical Exam Constitutional:      General: She is not in acute distress. HENT:     Right Ear: Tympanic membrane normal.     Left Ear: Tympanic membrane normal.     Nose: Nose normal.     Mouth/Throat:     Mouth: Mucous membranes are moist.     Pharynx: Oropharynx is clear.  Cardiovascular:     Rate and Rhythm: Normal rate and regular rhythm.     Heart sounds: Normal heart sounds.  Pulmonary:     Effort: Pulmonary effort is normal. No respiratory distress.     Breath sounds: Normal breath sounds.  Neurological:     Mental Status: She is alert.      UC Treatments / Results  Labs (all labs ordered are listed, but only abnormal results are displayed) Labs Reviewed  POCT RAPID STREP A (OFFICE)    EKG   Radiology No results found.  Procedures Procedures (including critical care time)  Medications Ordered in UC Medications - No data to display  Initial Impression / Assessment and Plan / UC Course  I have reviewed the triage vital signs and the nursing notes.  Pertinent labs & imaging results that were available during my care of the patient were reviewed by me and considered in my medical decision making (see chart for details).    Viral pharyngitis.  Afebrile and vital signs are stable.  Rapid strep negative.  Discussed symptomatic treatment including Tylenol  or ibuprofen as needed.  Education provided on sore throat.  Instructed patient to follow-up with her PCP if she is not improving.  She agrees to plan of care.  Final Clinical Impressions(s) / UC Diagnoses   Final diagnoses:  Viral pharyngitis     Discharge Instructions      Your strep test is negative.    Take Tylenol  or ibuprofen as needed for fever or discomfort.    Follow-up with your primary care provider if your symptoms are not improving.  ED Prescriptions   None    PDMP not reviewed this encounter.   Corlis Burnard DEL,  NP 09/05/24 1316

## 2024-09-05 NOTE — Discharge Instructions (Addendum)
Your strep test is negative.    Take Tylenol or ibuprofen as needed for fever or discomfort.    Follow-up with your primary care provider if your symptoms are not improving.     

## 2024-09-05 NOTE — ED Triage Notes (Signed)
 Patient to Urgent Care with complaints of a scratchy/ dry throat. Denies any fevers or other symptoms. Possible exposure to strep throat.   Symptoms x2-3 days.  Using mucinex liquid gels/ oregano oil.

## 2024-09-25 DIAGNOSIS — F411 Generalized anxiety disorder: Secondary | ICD-10-CM | POA: Diagnosis not present

## 2024-10-07 ENCOUNTER — Other Ambulatory Visit: Payer: Self-pay | Admitting: Family Medicine

## 2024-10-07 NOTE — Telephone Encounter (Unsigned)
 Copied from CRM [id number]. Topic: Clinical - Medication Refill >> Oct 07, 2024  4:49 PM Joesph B wrote: Medication:  amphetamine-dextroamphetamine (ADDERALL) 20 MG tablet [[id number]]  Has the patient contacted their pharmacy? Yes (Agent: If no, request that the patient contact the pharmacy for the refill. If patient does not wish to contact the pharmacy document the reason why and proceed with request.) (Agent: If yes, when and what did the pharmacy advise?)  This is the patient's preferred pharmacy:   [facility] - [place], [place] - [address] [place] [address] Phone: [phone] Fax: [phone]  Is this the correct pharmacy for this prescription? Yes If no, delete pharmacy and type the correct one.   Has the prescription been filled recently? No  Is the patient out of the medication? No  Has the patient been seen for an appointment in the last year OR does the patient have an upcoming appointment? Yes  Can we respond through MyChart? Yes  Agent: Please be advised that Rx refills may take up to 3 business days. We ask that you follow-up with your pharmacy.

## 2024-10-08 ENCOUNTER — Encounter: Payer: Self-pay | Admitting: Family Medicine

## 2024-10-08 ENCOUNTER — Ambulatory Visit (INDEPENDENT_AMBULATORY_CARE_PROVIDER_SITE_OTHER): Admitting: Family Medicine

## 2024-10-08 VITALS — BP 119/86 | HR 95 | Temp 98.1°F | Resp 20 | Ht 60.0 in | Wt 112.0 lb

## 2024-10-08 DIAGNOSIS — R61 Generalized hyperhidrosis: Secondary | ICD-10-CM | POA: Diagnosis not present

## 2024-10-08 DIAGNOSIS — F900 Attention-deficit hyperactivity disorder, predominantly inattentive type: Secondary | ICD-10-CM | POA: Diagnosis not present

## 2024-10-08 DIAGNOSIS — F5101 Primary insomnia: Secondary | ICD-10-CM | POA: Diagnosis not present

## 2024-10-08 MED ORDER — HYDROXYZINE HCL 10 MG PO TABS: ORAL_TABLET | ORAL | 3 refills | Status: AC

## 2024-10-08 MED ORDER — AMPHETAMINE-DEXTROAMPHETAMINE 20 MG PO TABS: 20.0000 mg | ORAL_TABLET | ORAL | 0 refills | Status: AC | PRN

## 2024-10-08 MED ORDER — ALUMINUM CHLORIDE 20 % EX SOLN: CUTANEOUS | 3 refills | Status: AC

## 2024-10-08 NOTE — Progress Notes (Unsigned)
   Established Patient Office Visit  Subjective   Patient ID: Kimberly Ware, female    DOB: November 22, 2001  Age: 23 y.o. MRN: [id number]  Chief Complaint  Patient presents with   Medical Management of Chronic Issues    HPI  Discussed the use of AI scribe software for clinical note transcription with the patient, who gave verbal consent to proceed.  History of Present Illness   Kimberly Ware is a 23 year old female who presents with anxiety, depression, and sleep disturbances.  She experiences anxiety and depression and has recently resumed therapy with a therapist she has known since high school. Her PHQ9 score is 11 and her GAD 7 score is 5.  She prefers to manage her condition without medication at this time. She has a history of using Xanax as needed, with the last refill in January, but has not taken it recently. She uses Adderall more frequently for motivation and concentration issues, taking 20 mg instant release as needed, but not daily, as she prefers to manage her symptoms independently.  She describes difficulty with concentration, often losing track of conversations and struggling to stay focused. When she takes Adderall, she realizes its benefits. She ensures to eat a good meal before taking Adderall to avoid appetite suppression.  She experiences sleep disturbances, characterized by difficulty falling asleep and waking up two to three times per night. She usually falls back asleep quickly but occasionally stays awake for several hours, affecting her daily functioning. She has tried magnesium, which caused diarrhea, and melatonin, which led to severe nightmares.  She reports excessive sweating, particularly in her armpits, which is embarrassing and limits her clothing choices. She occasionally uses glycopyrrolate  orally for events but finds it causes significant dryness, including dry mouth and skin. She has not used it in conjunction with Adderall due to concerns about dehydration.        Objective:     BP 119/86 (BP Location: Left Arm, Patient Position: Sitting, Cuff Size: Normal)   Pulse 95   Temp 98.1 F (36.7 C) (Oral)   Resp 20   Ht 5' (1.524 m)   Wt 112 lb (50.8 kg)   SpO2 97%   BMI 21.87 kg/m  {Vitals History (Optional):23777}  Physical Exam   {Perform Simple Foot Exam  Perform Detailed exam:1} {Insert foot Exam (Optional):30965}   No results found for any visits on 10/08/24.  {Labs (Optional):23779}  The ASCVD Risk score (Arnett DK, et al., 2019) failed to calculate for the following reasons:   The 2019 ASCVD risk score is only valid for ages 4 to 16    Assessment & Plan:  Hyperhidrosis -     Aluminum Chloride; Apply daily at bedtime for 3 days then twice a week to areas of excessive sweat.  Dispense: 225 mL; Refill: 3  Attention deficit hyperactivity disorder (ADHD), predominantly inattentive type -     Amphetamine-Dextroamphetamine; Take 1 tablet (20 mg total) by mouth as needed.  Dispense: 30 tablet; Refill: 0  Primary insomnia -     hydrOXYzine HCl; 1 or 2 po qhs  Dispense: 60 tablet; Refill: 3     Return in about 3 months (around 01/08/2025).    Kwame Ryland K Massie Cogliano, MD

## 2024-10-11 NOTE — Progress Notes (Unsigned)
 GYNECOLOGY ANNUAL PHYSICAL EXAM NOTE  Subjective:    Kimberly Ware is a 23 y.o. G0P0000 female who presents for an annual exam.  The patient is not currently sexually active. The patient participates in regular exercise: yes. Has the patient ever been transfused or tattooed?: yes. The patient reports that there is not domestic violence in her life. The patient has completed the Gardasil vaccine: yes  The patient has the following complaints today: She requests an STD screen.  She has not been sexually active in over 4 years but states she is paranoid and wants a screen anyway.  She is concerned about cost, however. Also requesting vitamin/mineral deficiency testing, pt self-reports as having OCD and wants to know.  PMH of PCOS and is scared of becoming infertile. Requesting testing to determine how many healthy eggs she has remaining. Is menstruating monthly.   Menstrual History: Menarche age: 53 Patient's last menstrual period was 09/20/2024 (exact date). Period Cycle (Days): 28 Period Duration (Days): 6 Period Pattern: (!) Irregular Menstrual Flow: Light Menstrual Control: Panty liner, Maxi pad Menstrual Control Change Freq (Hours): 2 Dysmenorrhea: (!) Mild Dysmenorrhea Symptoms: Cramping, Diarrhea  Gynecologic History:  Contraception: none History of STI's: Chlamydia, treated Last Pap: 11/01/2021. Results were: normal.   History of abnormal pap(s): None Last mammogram: N/A   OB History  Gravida Para Term Preterm AB Living  0 0 0 0 0 0  SAB IAB Ectopic Multiple Live Births  0 0 0 0 0    Past Medical History:  Diagnosis Date   Abdominal pain, recurrent    Anxiety    Breast mass 09/2017   resolved 12/18; neg breast u/s   Chlamydia 10/2017   Depression    Family history of adverse reaction to anesthesia    Mother - PONV   Spastic colon     Past Surgical History:  Procedure Laterality Date   NO PAST SURGERIES     TONSILLECTOMY N/A 11/08/2016    Procedure: TONSILLECTOMY;  Surgeon: Deward Dolly, MD;  Location: [facility];  Service: ENT;  Laterality: N/A;    Family History  Problem Relation Age of Onset   Arthritis Sister    Breast cancer Maternal Grandmother 48   Cancer Father        father had colonoscopy and found a polyp    Social History   Socioeconomic History   Marital status: Single    Spouse name: Not on file   Number of children: Not on file   Years of education: Not on file   Highest education level: Not on file  Occupational History   Not on file  Tobacco Use   Smoking status: Never    Passive exposure: Never   Smokeless tobacco: Former  Advertising Account Planner   Vaping status: Some Days  Substance and Sexual Activity   Alcohol use: Yes    Comment: socially   Drug use: No   Sexual activity: Not Currently    Birth control/protection: I.U.D.    Comment: Kyleena   Other Topics Concern   Not on file  Social History Narrative   Starting 6th grade.   Social Drivers of Corporate Investment Banker Strain: Not on file  Food Insecurity: Not on file  Transportation Needs: Not on file  Physical Activity: Sufficiently Active (12/25/2018)   Exercise Vital Sign    Days of Exercise per Week: 7 days    Minutes of Exercise per Session: 120 min  Stress: No Stress Concern Present (  12/25/2018)   [employer] of Occupational Health - Occupational Stress Questionnaire    Feeling of Stress : Not at all  Social Connections: Unknown (02/07/2023)   [facility]   Social Network    Social Network: Not on file  Intimate Partner Violence: Unknown (02/07/2023)   Received from [facility]   HITS    Physically Hurt: Not on file    Insult or Talk Down To: Not on file    Threaten Physical Harm: Not on file    Scream or Curse: Not on file    Current Outpatient Medications on File Prior to Visit  Medication Sig Dispense Refill   ALPRAZolam (XANAX) 0.5 MG tablet Take 0.5 mg by mouth 2 (two) times daily as  needed.     aluminum chloride (DRYSOL) 20 % external solution Apply daily at bedtime for 3 days then twice a week to areas of excessive sweat. 225 mL 3   amphetamine-dextroamphetamine (ADDERALL) 20 MG tablet Take 1 tablet (20 mg total) by mouth as needed. 30 tablet 0   hydrOXYzine (ATARAX) 10 MG tablet 1 or 2 po qhs 60 tablet 3   Minocycline  HCl Micronized (AMZEEQ ) 4 % FOAM Apply 1 application topically daily. Apply pea sized amount to entire face at night (after tretinoin  on the nights she is using tretinoin ) 30 g 1   Tazarotene (ARAZLO EX) Apply topically.     No current facility-administered medications on file prior to visit.    Allergies  Allergen Reactions   Ceclor [Cefaclor] Rash and Hives   Review of Systems Constitutional: negative for chills, fatigue, fevers and sweats Eyes: negative for irritation, redness and visual disturbance Ears, nose, mouth, throat, and face: negative for hearing loss, nasal congestion, snoring and tinnitus Respiratory: negative for asthma, cough, sputum Cardiovascular: negative for chest pain, dyspnea, exertional chest pressure/discomfort, irregular heart beat, palpitations and syncope Gastrointestinal: negative for abdominal pain, change in bowel habits, nausea and vomiting Genitourinary: negative for abnormal menstrual periods, genital lesions, sexual problems and vaginal discharge, dysuria and urinary incontinence Integument/breast: negative for breast lump, breast tenderness and nipple discharge Hematologic/lymphatic: negative for bleeding and easy bruising Musculoskeletal:negative for back pain and muscle weakness Neurological: negative for dizziness, headaches, vertigo and weakness Endocrine: negative for diabetic symptoms including polydipsia, polyuria and skin dryness Allergic/Immunologic: negative for hay fever and urticaria      Objective:  Blood pressure 110/72, pulse 95, height 5' (1.524 m), weight 113 lb 11.2 oz (51.6 kg), last menstrual  period 09/20/2024. Body mass index is 22.21 kg/m.    General Appearance:    Alert, cooperative, no distress, appears stated age  Head:    Normocephalic, without obvious abnormality, atraumatic  Eyes:    Conjunctiva/corneas clear, EOMs intact bilaterally  Ears:    Normal external ear canals bilaterally  Nose:   Nares normal  Throat:   Lips, mucosa, and tongue normal; teeth and gums normal  Neck:   Supple, symmetrical, trachea midline, no adenopathy; thyroid: no enlargement/tenderness/nodules; no carotid bruit or JVD  Back:     ROM normal, no CVA tenderness  Lungs:     Clear to auscultation bilaterally, respirations unlabored  Chest Wall:    No tenderness or deformity   Heart:    Regular rate and rhythm, S1 and S2 normal, no appreciated murmur, rub or gallop  Breast Exam:    No tenderness, masses, or nipple abnormality; no skin retraction, dimpling or nipple discharge.  Abdomen:     Soft, non-tender, bowel sounds  active all four quadrants, no masses, no organomegaly.    Genitalia:    Pelvic: external genitalia normal, vagina without lesions, discharge, or tenderness, rectovaginal septum  normal. Cervix normal in appearance, no cervical motion tenderness, no adnexal masses or tenderness.  Uterus normal size, shape, mobile, regular contours, nontender.  Rectal:    Normal external sphincter.  No hemorrhoids appreciated. Internal exam not done.   Extremities:   Extremities normal, atraumatic, no cyanosis or edema  Pulses:   2+ and symmetric all extremities  Skin:   Skin color, texture, turgor normal, no rashes or lesions  Lymph nodes:   Cervical, supraclavicular, and axillary nodes normal  Neurologic:   CNII-XII grossly intact, normal strength, sensation and reflexes throughout   Labs:  Lab Results  Component Value Date   WBC 7.4 11/01/2021   HGB 14.6 11/01/2021   HCT 42.8 11/01/2021   MCV 91 11/01/2021   PLT 235 11/01/2021    Lab Results  Component Value Date   CREATININE 0.72  11/01/2021   BUN 13 11/01/2021   NA 139 11/01/2021   K 5.0 11/01/2021   CL 102 11/01/2021   CO2 23 11/01/2021    Lab Results  Component Value Date   ALT 14 11/01/2021   AST 21 11/01/2021   ALKPHOS 51 11/01/2021   BILITOT 1.5 (H) 11/01/2021    Assessment:   1. Well woman exam with routine gynecological exam   2. Cervical cancer screening   3. Screen for STD (sexually transmitted disease)   4. History of PCOS      Plan:   Kimberly Ware is a 23 y.o. G0P0000 female here today for her annual exam, doing well.  Pap: done w/rflx today Labs: STI panel PHQ-2 = 10; has prior diagnosis and has therapist; continue Contraception: abstinence, declines  Vaccines: Gardasil complete Healthy lifestyle modifications discussed: multivitamin, diet, exercise, sunscreen. Emphasized importance of regular physical activity.  Folic Acid  recommendation reviewed.  All questions answered to patient's satisfaction.   PCOS: Recommend book 8 Steps to Reverse Your PCOS   Follow up 1 yr for annual, sooner prn.    Estil Mangle, DO [facility]

## 2024-10-14 NOTE — Patient Instructions (Signed)

## 2024-10-15 ENCOUNTER — Other Ambulatory Visit (HOSPITAL_COMMUNITY)
Admission: RE | Admit: 2024-10-15 | Discharge: 2024-10-15 | Disposition: A | Source: Ambulatory Visit | Attending: Obstetrics | Admitting: Obstetrics

## 2024-10-15 ENCOUNTER — Ambulatory Visit (INDEPENDENT_AMBULATORY_CARE_PROVIDER_SITE_OTHER): Admitting: Obstetrics

## 2024-10-15 ENCOUNTER — Encounter: Payer: Self-pay | Admitting: Obstetrics

## 2024-10-15 VITALS — BP 110/72 | HR 95 | Ht 60.0 in | Wt 113.7 lb

## 2024-10-15 DIAGNOSIS — Z01419 Encounter for gynecological examination (general) (routine) without abnormal findings: Secondary | ICD-10-CM | POA: Diagnosis not present

## 2024-10-15 DIAGNOSIS — Z124 Encounter for screening for malignant neoplasm of cervix: Secondary | ICD-10-CM

## 2024-10-15 DIAGNOSIS — Z113 Encounter for screening for infections with a predominantly sexual mode of transmission: Secondary | ICD-10-CM

## 2024-10-15 DIAGNOSIS — Z8742 Personal history of other diseases of the female genital tract: Secondary | ICD-10-CM | POA: Diagnosis not present

## 2024-10-16 ENCOUNTER — Other Ambulatory Visit: Payer: Self-pay

## 2024-10-16 ENCOUNTER — Telehealth: Payer: Self-pay | Admitting: Pharmacy Technician

## 2024-10-16 ENCOUNTER — Other Ambulatory Visit (HOSPITAL_COMMUNITY)
Admission: RE | Admit: 2024-10-16 | Discharge: 2024-10-16 | Disposition: A | Discharge status: Home / Self Care | Source: Ambulatory Visit | Attending: Obstetrics | Admitting: Obstetrics

## 2024-10-16 ENCOUNTER — Other Ambulatory Visit (HOSPITAL_COMMUNITY): Payer: Self-pay

## 2024-10-16 DIAGNOSIS — Z01419 Encounter for gynecological examination (general) (routine) without abnormal findings: Secondary | ICD-10-CM

## 2024-10-16 DIAGNOSIS — F411 Generalized anxiety disorder: Secondary | ICD-10-CM | POA: Diagnosis not present

## 2024-10-16 LAB — HEP, RPR, HIV PANEL
HIV Screen 4th Generation wRfx: NONREACTIVE
Hepatitis B Surface Ag: NEGATIVE
RPR Ser Ql: NONREACTIVE

## 2024-10-16 NOTE — Telephone Encounter (Signed)
 Pharmacy Patient Advocate Encounter   Received notification from Onbase that prior authorization for Drysol 20% solution is required/requested.   Insurance verification completed.   The patient is insured through [facility].   Per test claim: PA required; PA submitted to above mentioned insurance via Latent Key/confirmation #/EOC [id number] Status is pending

## 2024-10-17 ENCOUNTER — Ambulatory Visit: Payer: Self-pay

## 2024-10-17 ENCOUNTER — Other Ambulatory Visit (HOSPITAL_COMMUNITY): Payer: Self-pay

## 2024-10-17 LAB — CERVICOVAGINAL ANCILLARY ONLY
Bacterial Vaginitis (gardnerella): NEGATIVE
Candida Glabrata: NEGATIVE
Candida Vaginitis: NEGATIVE
Comment: NEGATIVE
Comment: NEGATIVE
Comment: NEGATIVE

## 2024-10-17 NOTE — Telephone Encounter (Signed)
 Pharmacy Patient Advocate Encounter  Received notification from [facility] that Prior Authorization for Drysol 20% solution  has been APPROVED from 10/17/2023 to 10/16/2025. Ran test claim, Copay is $12.21. This test claim was processed through [facility]- copay amounts may vary at other pharmacies due to pharmacy/plan contracts, or as the patient moves through the different stages of their insurance plan.   PA #/Case ID/Reference #: [id number]

## 2024-10-18 ENCOUNTER — Ambulatory Visit: Payer: Self-pay | Admitting: Obstetrics

## 2024-10-18 LAB — CYTOLOGY - PAP
Chlamydia: NEGATIVE
Comment: NEGATIVE
Comment: NEGATIVE
Comment: NORMAL
Diagnosis: NEGATIVE
Neisseria Gonorrhea: NEGATIVE
Trichomonas: NEGATIVE
